# Patient Record
Sex: Male | Born: 1940 | Race: White | Hispanic: No | Marital: Married | Smoking: Never smoker
Health system: Southern US, Community
[De-identification: ages and names within clinical notes are randomized; demographics above are authoritative.]

## PROBLEM LIST (undated history)

## (undated) DIAGNOSIS — E7800 Pure hypercholesterolemia, unspecified: Secondary | ICD-10-CM

## (undated) DIAGNOSIS — H409 Unspecified glaucoma: Secondary | ICD-10-CM

## (undated) DIAGNOSIS — G709 Myoneural disorder, unspecified: Secondary | ICD-10-CM

## (undated) DIAGNOSIS — E785 Hyperlipidemia, unspecified: Secondary | ICD-10-CM

## (undated) DIAGNOSIS — C801 Malignant (primary) neoplasm, unspecified: Secondary | ICD-10-CM

## (undated) DIAGNOSIS — I10 Essential (primary) hypertension: Secondary | ICD-10-CM

## (undated) DIAGNOSIS — M1990 Unspecified osteoarthritis, unspecified site: Secondary | ICD-10-CM

## (undated) DIAGNOSIS — H9190 Unspecified hearing loss, unspecified ear: Secondary | ICD-10-CM

## (undated) HISTORY — PX: JOINT REPLACEMENT: SHX530

## (undated) HISTORY — PX: SKIN CANCER EXCISION: SHX779

## (undated) HISTORY — PX: TONSILLECTOMY: SUR1361

## (undated) HISTORY — PX: BACK SURGERY: SHX140

## (undated) HISTORY — DX: Pure hypercholesterolemia, unspecified: E78.00

## (undated) HISTORY — DX: Essential (primary) hypertension: I10

## (undated) HISTORY — PX: TOTAL HIP ARTHROPLASTY: SHX124

---
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
## 1998-03-03 ENCOUNTER — Ambulatory Visit (HOSPITAL_COMMUNITY): Admission: RE | Admit: 1998-03-03 | Discharge: 1998-03-03 | Payer: Self-pay | Admitting: Neurosurgery

## 2000-09-20 HISTORY — PX: PARTIAL HIP ARTHROPLASTY: SHX733

## 2000-09-30 ENCOUNTER — Encounter: Payer: Self-pay | Admitting: Orthopedic Surgery

## 2000-10-04 ENCOUNTER — Inpatient Hospital Stay (HOSPITAL_COMMUNITY): Admission: RE | Admit: 2000-10-04 | Discharge: 2000-10-07 | Payer: Self-pay | Admitting: Orthopedic Surgery

## 2000-10-04 ENCOUNTER — Encounter: Payer: Self-pay | Admitting: Orthopedic Surgery

## 2004-09-20 HISTORY — PX: TOTAL HIP ARTHROPLASTY: SHX124

## 2005-08-26 ENCOUNTER — Inpatient Hospital Stay (HOSPITAL_COMMUNITY): Admission: RE | Admit: 2005-08-26 | Discharge: 2005-08-31 | Payer: Self-pay | Admitting: Orthopedic Surgery

## 2012-03-06 ENCOUNTER — Other Ambulatory Visit: Payer: Self-pay | Admitting: Neurosurgery

## 2012-03-06 DIAGNOSIS — M549 Dorsalgia, unspecified: Secondary | ICD-10-CM

## 2012-03-06 DIAGNOSIS — M5410 Radiculopathy, site unspecified: Secondary | ICD-10-CM

## 2012-03-06 DIAGNOSIS — IMO0002 Reserved for concepts with insufficient information to code with codable children: Secondary | ICD-10-CM

## 2012-03-09 ENCOUNTER — Ambulatory Visit
Admission: RE | Admit: 2012-03-09 | Discharge: 2012-03-09 | Disposition: A | Discharge status: Home / Self Care | Payer: Medicare Other | Source: Ambulatory Visit | Attending: Neurosurgery | Admitting: Neurosurgery

## 2012-03-09 VITALS — BP 136/74 | HR 66

## 2012-03-09 DIAGNOSIS — M5410 Radiculopathy, site unspecified: Secondary | ICD-10-CM

## 2012-03-09 DIAGNOSIS — M549 Dorsalgia, unspecified: Secondary | ICD-10-CM

## 2012-03-09 DIAGNOSIS — IMO0002 Reserved for concepts with insufficient information to code with codable children: Secondary | ICD-10-CM

## 2012-03-09 MED ORDER — ONDANSETRON HCL 4 MG/2ML IJ SOLN
4.0000 mg | Freq: Once | INTRAMUSCULAR | Status: AC
  Administered 2012-03-09: 4 mg via INTRAMUSCULAR

## 2012-03-09 MED ORDER — IOHEXOL 180 MG/ML  SOLN
20.0000 mL | Freq: Once | INTRAMUSCULAR | Status: AC | PRN
  Administered 2012-03-09: 20 mL via INTRATHECAL

## 2012-03-09 MED ORDER — MEPERIDINE HCL 100 MG/ML IJ SOLN
75.0000 mg | Freq: Once | INTRAMUSCULAR | Status: AC
  Administered 2012-03-09: 75 mg via INTRAMUSCULAR

## 2012-03-09 MED ORDER — DIAZEPAM 5 MG PO TABS
5.0000 mg | ORAL_TABLET | Freq: Once | ORAL | Status: AC
  Administered 2012-03-09: 5 mg via ORAL

## 2012-03-09 MED ORDER — ONDANSETRON HCL 4 MG/2ML IJ SOLN: 4.0000 mg | Freq: Four times a day (QID) | INTRAMUSCULAR | Status: DC | PRN

## 2012-03-09 NOTE — Discharge Instructions (Signed)

## 2012-03-17 ENCOUNTER — Other Ambulatory Visit: Payer: Self-pay | Admitting: Neurosurgery

## 2012-03-20 ENCOUNTER — Encounter (HOSPITAL_COMMUNITY): Payer: Self-pay | Admitting: Pharmacy Technician

## 2012-03-22 ENCOUNTER — Encounter (HOSPITAL_COMMUNITY): Payer: Self-pay

## 2012-03-22 ENCOUNTER — Encounter (HOSPITAL_COMMUNITY)
Admission: RE | Admit: 2012-03-22 | Discharge: 2012-03-22 | Disposition: A | Discharge status: Home / Self Care | Payer: Medicare Other | Source: Ambulatory Visit | Attending: Neurosurgery | Admitting: Neurosurgery

## 2012-03-22 HISTORY — DX: Malignant (primary) neoplasm, unspecified: C80.1

## 2012-03-22 HISTORY — DX: Unspecified osteoarthritis, unspecified site: M19.90

## 2012-03-22 HISTORY — DX: Unspecified hearing loss, unspecified ear: H91.90

## 2012-03-22 HISTORY — DX: Hyperlipidemia, unspecified: E78.5

## 2012-03-22 HISTORY — DX: Myoneural disorder, unspecified: G70.9

## 2012-03-22 HISTORY — DX: Unspecified glaucoma: H40.9

## 2012-03-22 LAB — CBC
HCT: 40.4 % (ref 39.0–52.0)
Hemoglobin: 13.9 g/dL (ref 13.0–17.0)
MCH: 31.7 pg (ref 26.0–34.0)
MCV: 92 fL (ref 78.0–100.0)
RBC: 4.39 MIL/uL (ref 4.22–5.81)
WBC: 5.5 10*3/uL (ref 4.0–10.5)

## 2012-03-22 LAB — BASIC METABOLIC PANEL
Chloride: 103 mEq/L (ref 96–112)
Creatinine, Ser: 0.75 mg/dL (ref 0.50–1.35)
GFR calc Af Amer: >90 mL/min (ref >90)
GFR calc non Af Amer: 89 mL/min — ABNORMAL LOW (ref >90)
Potassium: 4.3 mEq/L (ref 3.5–5.1)

## 2012-03-22 LAB — TYPE AND SCREEN
ABO/RH(D): A POS
Antibody Screen: NEGATIVE

## 2012-03-22 NOTE — Pre-Procedure Instructions (Signed)
20 ELWARD NOCERA  03/22/2012   Your procedure is scheduled on:  March 28, 2012  Report to [facility] at 8:00 AM.  Call this number if you have problems the morning of surgery: [phone]   Remember:   Do not eat food or drink :After Midnight.      Take these medicines the morning of surgery with A SIP OF WATER: zyrtec   Do not wear jewelry, make-up or nail polish.  Do not wear lotions, powders, or perfumes. You may wear deodorant.  Do not shave 48 hours prior to surgery. Men may shave face and neck.  Do not bring valuables to the hospital.  Contacts, dentures or bridgework may not be worn into surgery.  Leave suitcase in the car. After surgery it may be brought to your room.  For patients admitted to the hospital, checkout time is 11:00 AM the day of discharge.   Patients discharged the day of surgery will not be allowed to drive home.  Name and phone number of your driver: Abem Shaddix [phone]  Special Instructions: CHG Shower Use Special Wash: 1/2 bottle night before surgery and 1/2 bottle morning of surgery.   Please read over the following fact sheets that you were given: Pain Booklet, Coughing and Deep Breathing, Blood Transfusion Information, MRSA Information and Surgical Site Infection Prevention

## 2012-03-27 MED ORDER — CEFAZOLIN SODIUM-DEXTROSE 2-3 GM-% IV SOLR
2.0000 g | INTRAVENOUS | Status: AC
  Administered 2012-03-28: 2 g via INTRAVENOUS
  Filled 2012-03-27 (×2): qty 50

## 2012-03-28 ENCOUNTER — Inpatient Hospital Stay (HOSPITAL_COMMUNITY): Payer: Medicare Other | Admitting: Anesthesiology

## 2012-03-28 ENCOUNTER — Encounter (HOSPITAL_COMMUNITY)
Admission: RE | Disposition: A | Discharge status: Home / Self Care | Payer: Self-pay | Source: Ambulatory Visit | Attending: Neurosurgery

## 2012-03-28 ENCOUNTER — Inpatient Hospital Stay (HOSPITAL_COMMUNITY)
Admission: RE | Admit: 2012-03-28 | Discharge: 2012-04-03 | DRG: 460 | Disposition: A | Discharge status: Home / Self Care | Payer: Medicare Other | Source: Ambulatory Visit | Attending: Neurosurgery | Admitting: Neurosurgery

## 2012-03-28 ENCOUNTER — Inpatient Hospital Stay (HOSPITAL_COMMUNITY): Payer: Medicare Other

## 2012-03-28 ENCOUNTER — Encounter (HOSPITAL_COMMUNITY): Payer: Self-pay | Admitting: Anesthesiology

## 2012-03-28 DIAGNOSIS — Z981 Arthrodesis status: Secondary | ICD-10-CM

## 2012-03-28 DIAGNOSIS — M48061 Spinal stenosis, lumbar region without neurogenic claudication: Principal | ICD-10-CM | POA: Diagnosis present

## 2012-03-28 DIAGNOSIS — M4310 Spondylolisthesis, site unspecified: Secondary | ICD-10-CM | POA: Diagnosis present

## 2012-03-28 LAB — GLUCOSE, CAPILLARY: Glucose-Capillary: 127 mg/dL — ABNORMAL HIGH (ref 70–99)

## 2012-03-28 SURGERY — POSTERIOR LUMBAR FUSION 1 LEVEL
Anesthesia: General | Site: Back | Wound class: Clean

## 2012-03-28 MED ORDER — MORPHINE SULFATE (PF) 1 MG/ML IV SOLN
INTRAVENOUS | Status: AC
  Administered 2012-03-28: 1 mg via INTRAVENOUS
  Filled 2012-03-28: qty 25

## 2012-03-28 MED ORDER — SODIUM CHLORIDE 0.9 % IV SOLN
INTRAVENOUS | Status: DC
  Administered 2012-03-28: 23:00:00 via INTRAVENOUS

## 2012-03-28 MED ORDER — LACTATED RINGERS IV SOLN
INTRAVENOUS | Status: DC | PRN
  Administered 2012-03-28 (×2): via INTRAVENOUS

## 2012-03-28 MED ORDER — PROPOFOL 10 MG/ML IV BOLUS
INTRAVENOUS | Status: DC | PRN
  Administered 2012-03-28: 120 mg via INTRAVENOUS

## 2012-03-28 MED ORDER — DIPHENHYDRAMINE HCL 50 MG/ML IJ SOLN: 12.5000 mg | Freq: Four times a day (QID) | INTRAMUSCULAR | Status: DC | PRN

## 2012-03-28 MED ORDER — ROCURONIUM BROMIDE 100 MG/10ML IV SOLN
INTRAVENOUS | Status: DC | PRN
  Administered 2012-03-28: 50 mg via INTRAVENOUS

## 2012-03-28 MED ORDER — DIAZEPAM 5 MG PO TABS
5.0000 mg | ORAL_TABLET | Freq: Four times a day (QID) | ORAL | Status: DC | PRN
  Administered 2012-03-29 – 2012-04-02 (×10): 5 mg via ORAL
  Filled 2012-03-28 (×10): qty 1

## 2012-03-28 MED ORDER — ACETAMINOPHEN 325 MG PO TABS
650.0000 mg | ORAL_TABLET | ORAL | Status: DC | PRN
  Administered 2012-03-29: 650 mg via ORAL
  Filled 2012-03-28: qty 2

## 2012-03-28 MED ORDER — SODIUM CHLORIDE 0.9 % IJ SOLN: 3.0000 mL | INTRAMUSCULAR | Status: DC | PRN

## 2012-03-28 MED ORDER — LIDOCAINE HCL (CARDIAC) 20 MG/ML IV SOLN
INTRAVENOUS | Status: DC | PRN
  Administered 2012-03-28: 60 mg via INTRAVENOUS

## 2012-03-28 MED ORDER — NALOXONE HCL 0.4 MG/ML IJ SOLN: 0.4000 mg | INTRAMUSCULAR | Status: DC | PRN

## 2012-03-28 MED ORDER — MENTHOL 3 MG MT LOZG
1.0000 | LOZENGE | OROMUCOSAL | Status: DC | PRN
  Filled 2012-03-28: qty 9

## 2012-03-28 MED ORDER — VECURONIUM BROMIDE 10 MG IV SOLR
INTRAVENOUS | Status: DC | PRN
  Administered 2012-03-28: 2 mg via INTRAVENOUS

## 2012-03-28 MED ORDER — SODIUM CHLORIDE 0.9 % IV SOLN: 250.0000 mL | INTRAVENOUS | Status: DC

## 2012-03-28 MED ORDER — MORPHINE SULFATE (PF) 1 MG/ML IV SOLN
INTRAVENOUS | Status: DC
  Administered 2012-03-28: 1 mg via INTRAVENOUS
  Administered 2012-03-29: 08:00:00 via INTRAVENOUS
  Filled 2012-03-28: qty 25

## 2012-03-28 MED ORDER — LATANOPROST 0.005 % OP SOLN
1.0000 [drp] | Freq: Every day | OPHTHALMIC | Status: DC
  Administered 2012-03-28 – 2012-04-02 (×6): 1 [drp] via OPHTHALMIC
  Filled 2012-03-28: qty 2.5

## 2012-03-28 MED ORDER — EPHEDRINE SULFATE 50 MG/ML IJ SOLN
INTRAMUSCULAR | Status: DC | PRN
  Administered 2012-03-28 (×3): 10 mg via INTRAVENOUS

## 2012-03-28 MED ORDER — ONDANSETRON HCL 4 MG/2ML IJ SOLN: 4.0000 mg | Freq: Four times a day (QID) | INTRAMUSCULAR | Status: DC | PRN

## 2012-03-28 MED ORDER — DIPHENHYDRAMINE HCL 12.5 MG/5ML PO ELIX: 12.5000 mg | ORAL_SOLUTION | Freq: Four times a day (QID) | ORAL | Status: DC | PRN

## 2012-03-28 MED ORDER — 0.9 % SODIUM CHLORIDE (POUR BTL) OPTIME
TOPICAL | Status: DC | PRN
  Administered 2012-03-28: 1000 mL

## 2012-03-28 MED ORDER — THROMBIN 20000 UNITS EX KIT
PACK | CUTANEOUS | Status: DC | PRN
  Administered 2012-03-28: 16:00:00 via TOPICAL

## 2012-03-28 MED ORDER — ONDANSETRON HCL 4 MG/2ML IJ SOLN
4.0000 mg | INTRAMUSCULAR | Status: DC | PRN
  Filled 2012-03-28: qty 2

## 2012-03-28 MED ORDER — PHENOL 1.4 % MT LIQD: 1.0000 | OROMUCOSAL | Status: DC | PRN

## 2012-03-28 MED ORDER — HYDROMORPHONE HCL PF 1 MG/ML IJ SOLN: 0.2500 mg | INTRAMUSCULAR | Status: DC | PRN

## 2012-03-28 MED ORDER — SODIUM CHLORIDE 0.9 % IV SOLN
INTRAVENOUS | Status: DC | PRN
  Administered 2012-03-28 (×3): via INTRAVENOUS

## 2012-03-28 MED ORDER — CEFAZOLIN SODIUM 1-5 GM-% IV SOLN
1.0000 g | Freq: Three times a day (TID) | INTRAVENOUS | Status: AC
  Administered 2012-03-28 – 2012-03-29 (×2): 1 g via INTRAVENOUS
  Filled 2012-03-28 (×2): qty 50

## 2012-03-28 MED ORDER — SODIUM CHLORIDE 0.9 % IJ SOLN: 9.0000 mL | INTRAMUSCULAR | Status: DC | PRN

## 2012-03-28 MED ORDER — MIDAZOLAM HCL 5 MG/5ML IJ SOLN
INTRAMUSCULAR | Status: DC | PRN
  Administered 2012-03-28: 2 mg via INTRAVENOUS

## 2012-03-28 MED ORDER — FENTANYL CITRATE 0.05 MG/ML IJ SOLN
INTRAMUSCULAR | Status: DC | PRN
  Administered 2012-03-28 (×8): 50 ug via INTRAVENOUS
  Administered 2012-03-28: 100 ug via INTRAVENOUS

## 2012-03-28 MED ORDER — ACETAMINOPHEN 650 MG RE SUPP: 650.0000 mg | RECTAL | Status: DC | PRN

## 2012-03-28 MED ORDER — BACITRACIN ZINC 500 UNIT/GM EX OINT
TOPICAL_OINTMENT | CUTANEOUS | Status: DC | PRN
  Administered 2012-03-28: 1 via TOPICAL

## 2012-03-28 MED ORDER — DROPERIDOL 2.5 MG/ML IJ SOLN: 0.6250 mg | INTRAMUSCULAR | Status: DC | PRN

## 2012-03-28 MED ORDER — OXYCODONE-ACETAMINOPHEN 5-325 MG PO TABS
1.0000 | ORAL_TABLET | ORAL | Status: DC | PRN
  Administered 2012-03-29 – 2012-04-03 (×18): 2 via ORAL
  Filled 2012-03-28 (×17): qty 2

## 2012-03-28 MED ORDER — SIMVASTATIN 40 MG PO TABS
40.0000 mg | ORAL_TABLET | Freq: Every day | ORAL | Status: DC
  Administered 2012-03-28 – 2012-04-02 (×6): 40 mg via ORAL
  Filled 2012-03-28 (×8): qty 1

## 2012-03-28 MED ORDER — SODIUM CHLORIDE 0.9 % IJ SOLN: 3.0000 mL | Freq: Two times a day (BID) | INTRAMUSCULAR | Status: DC

## 2012-03-28 MED ORDER — HEPARIN SODIUM (PORCINE) 1000 UNIT/ML IJ SOLN
INTRAMUSCULAR | Status: AC
  Filled 2012-03-28: qty 1

## 2012-03-28 SURGICAL SUPPLY — 78 items
APL SKNCLS STERI-STRIP NONHPOA (GAUZE/BANDAGES/DRESSINGS) ×1
BENZOIN TINCTURE PRP APPL 2/3 (GAUZE/BANDAGES/DRESSINGS) ×2 IMPLANT
BLADE SURG ROTATE 9660 (MISCELLANEOUS) IMPLANT
BUR ACORN 6.0 (BURR) ×4 IMPLANT
BUR MATCHSTICK NEURO 3.0 LAGG (BURR) ×4 IMPLANT
CANISTER SUCTION 2500CC (MISCELLANEOUS) ×2 IMPLANT
CAP REVERE LOCKING (Cap) ×8 IMPLANT
CLOTH BEACON ORANGE TIMEOUT ST (SAFETY) ×2 IMPLANT
CONT SPEC 4OZ CLIKSEAL STRL BL (MISCELLANEOUS) ×4 IMPLANT
CORDS BIPOLAR (ELECTRODE) ×2 IMPLANT
COVER BACK TABLE 24X17X13 BIG (DRAPES) IMPLANT
COVER TABLE BACK 60X90 (DRAPES) ×2 IMPLANT
DRAPE C-ARM 42X72 X-RAY (DRAPES) ×4 IMPLANT
DRAPE LAPAROTOMY 100X72X124 (DRAPES) ×2 IMPLANT
DRAPE MICROSCOPE LEICA (MISCELLANEOUS) ×2 IMPLANT
DRAPE POUCH INSTRU U-SHP 10X18 (DRAPES) ×2 IMPLANT
DRSG PAD ABDOMINAL 8X10 ST (GAUZE/BANDAGES/DRESSINGS) ×2 IMPLANT
DURAPREP 26ML APPLICATOR (WOUND CARE) ×2 IMPLANT
ELECT BLADE 4.0 EZ CLEAN MEGAD (MISCELLANEOUS) ×2
ELECT REM PT RETURN 9FT ADLT (ELECTROSURGICAL) ×2
ELECTRODE BLDE 4.0 EZ CLN MEGD (MISCELLANEOUS) ×1 IMPLANT
ELECTRODE REM PT RTRN 9FT ADLT (ELECTROSURGICAL) ×1 IMPLANT
EVACUATOR 1/8 PVC DRAIN (DRAIN) IMPLANT
GAUZE SPONGE 4X4 16PLY XRAY LF (GAUZE/BANDAGES/DRESSINGS) ×2 IMPLANT
GLOVE BIO SURGEON STRL SZ 6.5 (GLOVE) ×4 IMPLANT
GLOVE BIOGEL M 8.0 STRL (GLOVE) ×4 IMPLANT
GLOVE BIOGEL PI IND STRL 6.5 (GLOVE) ×1 IMPLANT
GLOVE BIOGEL PI IND STRL 7.0 (GLOVE) ×1 IMPLANT
GLOVE BIOGEL PI IND STRL 7.5 (GLOVE) ×1 IMPLANT
GLOVE BIOGEL PI IND STRL 8 (GLOVE) ×2 IMPLANT
GLOVE BIOGEL PI INDICATOR 6.5 (GLOVE) ×1
GLOVE BIOGEL PI INDICATOR 7.0 (GLOVE) ×1
GLOVE BIOGEL PI INDICATOR 7.5 (GLOVE) ×1
GLOVE BIOGEL PI INDICATOR 8 (GLOVE) ×2
GLOVE ECLIPSE 7.5 STRL STRAW (GLOVE) ×8 IMPLANT
GLOVE EXAM NITRILE LRG STRL (GLOVE) IMPLANT
GLOVE EXAM NITRILE MD LF STRL (GLOVE) ×2 IMPLANT
GLOVE EXAM NITRILE XL STR (GLOVE) IMPLANT
GLOVE EXAM NITRILE XS STR PU (GLOVE) IMPLANT
GLOVE SURG SS PI 6.5 STRL IVOR (GLOVE) ×4 IMPLANT
GOWN BRE IMP SLV AUR LG STRL (GOWN DISPOSABLE) ×8 IMPLANT
GOWN BRE IMP SLV AUR XL STRL (GOWN DISPOSABLE) ×4 IMPLANT
GOWN STRL REIN 2XL LVL4 (GOWN DISPOSABLE) ×2 IMPLANT
KIT BASIN OR (CUSTOM PROCEDURE TRAY) ×2 IMPLANT
KIT ROOM TURNOVER OR (KITS) ×2 IMPLANT
MILL MEDIUM DISP (BLADE) ×2 IMPLANT
NEEDLE HYPO 18GX1.5 BLUNT FILL (NEEDLE) IMPLANT
NEEDLE HYPO 21X1.5 SAFETY (NEEDLE) ×2 IMPLANT
NEEDLE HYPO 25X1 1.5 SAFETY (NEEDLE) IMPLANT
NS IRRIG 1000ML POUR BTL (IV SOLUTION) ×2 IMPLANT
PACK FOAM VITOSS 10CC (Orthopedic Implant) ×2 IMPLANT
PACK LAMINECTOMY NEURO (CUSTOM PROCEDURE TRAY) ×2 IMPLANT
PAD ARMBOARD 7.5X6 YLW CONV (MISCELLANEOUS) ×6 IMPLANT
PATTIES SURGICAL .5 X1 (DISPOSABLE) ×2 IMPLANT
PATTIES SURGICAL .5 X3 (DISPOSABLE) IMPLANT
PATTIES SURGICAL 1X1 (DISPOSABLE) ×4 IMPLANT
ROD REVERE 6.35 40MM (Rod) ×4 IMPLANT
RUBBERBAND STERILE (MISCELLANEOUS) ×4 IMPLANT
SCREW REVERE 5.5X45 (Screw) ×8 IMPLANT
SPACER SUSTAIN SMALL ARCH 9MM (Peek) ×2 IMPLANT
SPONGE GAUZE 4X4 12PLY (GAUZE/BANDAGES/DRESSINGS) ×2 IMPLANT
SPONGE LAP 4X18 X RAY DECT (DISPOSABLE) IMPLANT
SPONGE NEURO XRAY DETECT 1X3 (DISPOSABLE) IMPLANT
SPONGE SURGIFOAM ABS GEL 100 (HEMOSTASIS) ×2 IMPLANT
STAPLER SKIN PROX WIDE 3.9 (STAPLE) ×2 IMPLANT
STRIP CLOSURE SKIN 1/2X4 (GAUZE/BANDAGES/DRESSINGS) ×2 IMPLANT
SUT VIC AB 1 CT1 18XBRD ANBCTR (SUTURE) ×2 IMPLANT
SUT VIC AB 1 CT1 8-18 (SUTURE) ×4
SUT VIC AB 2-0 CP2 18 (SUTURE) ×4 IMPLANT
SUT VIC AB 3-0 SH 8-18 (SUTURE) ×2 IMPLANT
SYR 20CC LL (SYRINGE) ×2 IMPLANT
SYR 20ML ECCENTRIC (SYRINGE) ×2 IMPLANT
SYR 5ML LL (SYRINGE) IMPLANT
TAPE CLOTH SURG 4X10 WHT LF (GAUZE/BANDAGES/DRESSINGS) ×2 IMPLANT
TOWEL OR 17X24 6PK STRL BLUE (TOWEL DISPOSABLE) ×2 IMPLANT
TOWEL OR 17X26 10 PK STRL BLUE (TOWEL DISPOSABLE) ×2 IMPLANT
TRAY FOLEY CATH 14FRSI W/METER (CATHETERS) ×2 IMPLANT
WATER STERILE IRR 1000ML POUR (IV SOLUTION) ×2 IMPLANT

## 2012-03-28 NOTE — Anesthesia Postprocedure Evaluation (Signed)
Anesthesia Post Note  Patient: William [name]  Procedure(s) Performed: Procedure(s) (LRB): POSTERIOR LUMBAR FUSION 1 LEVEL (N/A)  Anesthesia type: general  Patient location: PACU  Post pain: Pain level controlled  Post assessment: Patient's Cardiovascular Status Stable  Last Vitals:  Filed Vitals:   03/28/12 2015  BP:   Pulse: 79  Temp: 36.8 C  Resp: 8    Post vital signs: Reviewed and stable  Level of consciousness: sedated  Complications: No apparent anesthesia complications

## 2012-03-28 NOTE — Anesthesia Procedure Notes (Signed)
Procedure Name: Intubation Date/Time: 03/28/2012 3:14 PM Performed by: Alanda Amass A Pre-anesthesia Checklist: Patient identified, Timeout performed, Emergency Drugs available, Suction available and Patient being monitored Patient Re-evaluated:Patient Re-evaluated prior to inductionOxygen Delivery Method: Circle system utilized Preoxygenation: Pre-oxygenation with 100% oxygen Intubation Type: IV induction Ventilation: Mask ventilation without difficulty Laryngoscope Size: Mac and 3 Grade View: Grade I Tube type: Oral Tube size: 7.5 mm Number of attempts: 1 Airway Equipment and Method: Stylet Placement Confirmation: ETT inserted through vocal cords under direct vision,  breath sounds checked- equal and bilateral and positive ETCO2 Secured at: 21 cm Tube secured with: Tape Dental Injury: Teeth and Oropharynx as per pre-operative assessment

## 2012-03-28 NOTE — H&P (Signed)
William [name] is an 71 y.o. male.   Chief Complaint: lbp HPI: several years history with lumbar pain associated with both legs pain with ambulation.epidural steroids of no help. Standing   is painful. Had a mri and xrays and he was send to [place] for treatment.  Past Medical History  Diagnosis Date  . Hyperlipemia   . Diabetes mellitus     borderline controlled with diet  . Neuromuscular disorder     numbness/tingling lower extremeties  . Cancer     skin ca, basal cell  . Arthritis   . Glaucoma (increased eye pressure)     bilaterally  . Hearing difficulty     "has ringing in left ear"    Past Surgical History  Procedure Date  . Appendectomy   . Tonsillectomy   . Total hip arthroplasty     x 2  . Partial hip arthroplasty     x 1  . Back surgery     low back surgery right side 1997  . Skin cancer excision     No family history on file. Social History:  reports that he has never smoked. He does not have any smokeless tobacco history on file. He reports that he does not drink alcohol or use illicit drugs.  Allergies: No Known Allergies  Medications Prior to Admission  Medication Sig Dispense Refill  . cetirizine (ZYRTEC) 10 MG tablet Take 10 mg by mouth daily as needed. For allergies      . latanoprost (XALATAN) 0.005 % ophthalmic solution Place 1 drop into both eyes at bedtime.      Marland Kitchen Lysine 500 MG CAPS Take 1 capsule by mouth daily.      . simvastatin (ZOCOR) 40 MG tablet Take 40 mg by mouth at bedtime.      Marland Kitchen aspirin EC 81 MG tablet Take 81 mg by mouth daily.      . Multiple Vitamin (MULTIVITAMIN WITH MINERALS) TABS Take 1 tablet by mouth daily.      . naproxen sodium (ANAPROX) 220 MG tablet Take 220 mg by mouth 2 (two) times daily with a meal.      . Omega-3 Fatty Acids (FISH OIL) 1200 MG CAPS Take 2 capsules by mouth daily.        Results for orders placed during the hospital encounter of 03/28/12 (from the past 48 hour(s))  GLUCOSE, CAPILLARY     Status: Abnormal     Collection Time   03/28/12  8:20 AM      Component Value Range Comment   Glucose-Capillary 127 (*) 70 - 99 mg/dL    Comment 1 Notify RN      No results found.  Review of Systems  Constitutional: Negative.   HENT: Positive for hearing loss.   Eyes: Negative.   Respiratory: Negative.   Cardiovascular: Negative.   Gastrointestinal: Negative.   Musculoskeletal: Positive for back pain.  Skin: Negative.   Neurological: Positive for focal weakness.  Endo/Heme/Allergies: Negative.   Psychiatric/Behavioral: Negative.     Blood pressure 161/78, pulse 61, temperature 97.5 F (36.4 C), temperature source Oral, resp. rate 20, SpO2 99.00%. Physical Exam hent.decrease of hearing.neck, nl. Cv, nl. Lungs mild rales bilaterally. Abdome, soft. Extremities, nl NEURO patient came to my office limping and using a cane. 2/5 weakness of feet df and 4/5 pf. i can break both quadriceps . Femoral maneuver positive bilaterally. Mri shows stenosis from l1 to l5patient and wifwe are aware of risks and benefits of the surgery  with step-off at l45.   Assessment/Plandecompression from l1 to l5 with fusion with pedicles screws and cages at l45.   William [name] M 03/28/2012, 12:50 PM

## 2012-03-28 NOTE — Anesthesia Preprocedure Evaluation (Addendum)
Anesthesia Evaluation  Patient identified by MRN, date of birth, ID band Patient awake    Reviewed: Allergy & Precautions, H&P , NPO status , Patient's Chart, lab work & pertinent test results  Airway Mallampati: II TM Distance: >3 FB Neck ROM: Full    Dental No notable dental hx. (+) Partial Lower, Partial Upper and Dental Advisory Given   Pulmonary neg pulmonary ROS,  breath sounds clear to auscultation  Pulmonary exam normal       Cardiovascular negative cardio ROS  Rhythm:Regular Rate:Normal     Neuro/Psych negative neurological ROS  negative psych ROS   GI/Hepatic negative GI ROS, Neg liver ROS,   Endo/Other  Well Controlled  Renal/GU negative Renal ROS  negative genitourinary   Musculoskeletal   Abdominal   Peds  Hematology negative hematology ROS (+)   Anesthesia Other Findings   Reproductive/Obstetrics negative OB ROS                           Anesthesia Physical Anesthesia Plan  ASA: II  Anesthesia Plan: General   Post-op Pain Management:    Induction: Intravenous  Airway Management Planned: Oral ETT  Additional Equipment:   Intra-op Plan:   Post-operative Plan: Extubation in OR  Informed Consent: I have reviewed the patients History and Physical, chart, labs and discussed the procedure including the risks, benefits and alternatives for the proposed anesthesia with the patient or authorized representative who has indicated his/her understanding and acceptance.   Dental advisory given  Plan Discussed with: CRNA and Surgeon  Anesthesia Plan Comments:         Anesthesia Quick Evaluation

## 2012-03-28 NOTE — Transfer of Care (Signed)
Immediate Anesthesia Transfer of Care Note  Patient: William [name]  Procedure(s) Performed: Procedure(s) (LRB): POSTERIOR LUMBAR FUSION 1 LEVEL (N/A)  Patient Location: PACU  Anesthesia Type: General  Level of Consciousness: sedated  Airway & Oxygen Therapy: Patient Spontanous Breathing and Patient connected to nasal cannula oxygen  Post-op Assessment: Report given to PACU RN and Post -op Vital signs reviewed and stable  Post vital signs: Reviewed and stable  Complications: No apparent anesthesia complications

## 2012-03-29 ENCOUNTER — Encounter (HOSPITAL_COMMUNITY): Payer: Self-pay | Admitting: *Deleted

## 2012-03-29 MED ORDER — PNEUMOCOCCAL VAC POLYVALENT 25 MCG/0.5ML IJ INJ
0.5000 mL | INJECTION | INTRAMUSCULAR | Status: AC
  Administered 2012-03-30: 0.5 mL via INTRAMUSCULAR
  Filled 2012-03-29: qty 0.5

## 2012-03-29 NOTE — Op Note (Signed)
NAMEYVON, MCCORD NO.:  [id number]  MEDICAL RECORD NO.:  [id number]  LOCATION:  4N06C                        FACILITY:  [facility]  PHYSICIAN:  Hilda Lias, M.D.   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  PROCEDURE:  Bilateral L2, L3, L4, and L5 laminectomy, partial of L1, decompression of the thecal sac, foraminotomy.  L4-5 diskectomy, left side.  Lysis of adhesion, facetectomy, interbody fusion with a cage. Pedicle screws at L4-L5, posterolateral arthrodesis with autograft and Vitoss.  Cell Saver, C-arm.  Microscope.  SURGEON:  Hilda Lias, MD  ASSISTANT:  Reinaldo Meeker, MD  CLINICAL HISTORY:  Mr. Morillo is a gentleman who in the past underwent surgery at the level of 4-5 in the right side.  He came to my office several weeks ago with his wife complaining of back pain radiation to both legs up to the point and it was quite painful.  Walking after 40-50 feet, he had to sit because of severe pain.  X-rays showed severe stenosis from L1-L2 to L5-S1 with a grade 1 spondylolisthesis at the level of 4-5.  The patient want to proceed with surgery, and the surgery was fully explained to him and his wife including the complication such as no improvement whatsoever, infection, CSF leak, no improvement, need of further surgery.  PROCEDURE:  The patient was taken to the OR, and after intubation, he was positioned in a prone manner.  The back was cleaned with DuraPrep. Drapes were applied.  Midline incision following the previous one from L1-L2 down to L5 was made.  The incision was carried straight down to the  fascia and muscle tissue.  Retraction was done laterally.  X-rays showed that the clip was at the level of L2, spinous process, and L4. From then on, we proceeded with the removal of spinous process of 2, 3, 4, 5 as well as the lamina at those three levels.  The patient had quite a bit of narrowing.  At the level of L1-L2, we did partial laminotomy of L1 with removal of the yellow ligament and decompression of the thecal sac as well as the yellow ligament.  The area was quite narrow, not only at 2-3, but also at 3-4.  The patient had quite a bit of calcification of the yellow ligament and decompression was achieved.  Then, our attention was at the level of 4-5.  The lamina was removed easily and in the left side, we were able to go laterally.  In the right side, the patient had quite a bit of adhesion to the point that it was quite difficult to decompress the L5.  The L4 was decompressed with the help of the microscope.  Then, decompression of the L5 area was done. Because of the adhesion in the right side at L4-5, we entered the disk space in the left side  and diskectomy from that side was done.  The endplate was removed.  Then, a banana cage with autograft was inserted. Then, using the C-arm first in the AP view and then in lateral view, we probe the pedicle of L4-L5.  After the hole was made, we were able to feel that we were ultimately surrounded by bone.  Then, four screws of 5.5 x 45 was inserted and the screw was secured in place with a rod and Capps.  Then, we went laterally and the periosteum of the L4-5 bilaterally was removed and a mix of Vitoss and autograft was used for arthrodesis.  With the microscope, we went back and we were able to decompress the foramen of L1-2, L2-3, L3-4, and L4-5 bilaterally except at the L5 in the right side.  The L1 was wide open. Valsalva maneuver twice up to 40 was negative.  Then, the area was irrigated and the wound was closed with Vicryl and  staple.          ______________________________ Hilda Lias, M.D.     EB/MEDQ  D:  03/28/2012  T:  03/29/2012  Job:  [id number]

## 2012-03-29 NOTE — Progress Notes (Signed)
UR COMPLETED  

## 2012-03-29 NOTE — Progress Notes (Addendum)
NT reported temp of 101.0. Pt given IS and instructed on its use and tylenol. Will recheck temp shortly.- Follow up temperature is 99.1 Pt temp at 0600 was 100.7. Oncoming RN notified.

## 2012-03-29 NOTE — Evaluation (Signed)
Physical Therapy Evaluation Patient Details Name: William [name] MRN: [id number] DOB: 1941-07-12 Today's Date: 03/29/2012 Time: 7829-5621 PT Time Calculation (min): 18 min  PT Assessment / Plan / Recommendation Clinical Impression  Pt is a 71 y/o male admitted s/p L1-5 decompression and fusion along with the below PT problem list.  Pt would benefit from acute PT to maximize independence and facilitate d/c home with HHPT.    PT Assessment  Patient needs continued PT services    Follow Up Recommendations  Home health PT    Barriers to Discharge None      Equipment Recommendations  Rolling walker with 5" wheels    Recommendations for Other Services     Frequency Min 5X/week    Precautions / Restrictions Precautions Precautions: Back Precaution Booklet Issued: Yes (comment) Precaution Comments: Educated on 3/3 back precautions. Required Braces or Orthoses: Spinal Brace Spinal Brace: Lumbar corset;Applied in sitting position Restrictions Weight Bearing Restrictions: No   Pertinent Vitals/Pain 5/10 in back.  Pt repositioned.      Mobility  Bed Mobility Bed Mobility: Not assessed Transfers Transfers: Sit to Stand;Stand to Sit Sit to Stand: 4: Min assist;With upper extremity assist;From chair/3-in-1 Stand to Sit: 4: Min assist;With upper extremity assist;To chair/3-in-1 Details for Transfer Assistance: Assist for balance with cues for safest hand placement. Ambulation/Gait Ambulation/Gait Assistance: 4: Min assist Ambulation Distance (Feet): 40 Feet Assistive device: Rolling walker Ambulation/Gait Assistance Details: Assist for balance with cues for tall posture with extension at trunk and hips/knees and safety inside RW. Gait Pattern: Decreased stride length;Antalgic;Trunk flexed Stairs: No Wheelchair Mobility Wheelchair Mobility: No    Exercises     PT Diagnosis: Difficulty walking;Acute pain  PT Problem List: Decreased strength;Decreased activity  tolerance;Decreased balance;Decreased mobility;Decreased knowledge of use of DME;Decreased knowledge of precautions;Pain PT Treatment Interventions: DME instruction;Gait training;Stair training;Functional mobility training;Therapeutic activities;Balance training;Patient/family education   PT Goals Acute Rehab PT Goals PT Goal Formulation: With patient/family Time For Goal Achievement: 04/05/12 Potential to Achieve Goals: Good Pt will Roll Supine to Right Side: with modified independence PT Goal: Rolling Supine to Right Side - Progress: Goal set today Pt will Roll Supine to Left Side: with modified independence PT Goal: Rolling Supine to Left Side - Progress: Goal set today Pt will go Supine/Side to Sit: with modified independence PT Goal: Supine/Side to Sit - Progress: Goal set today Pt will go Sit to Supine/Side: with modified independence PT Goal: Sit to Supine/Side - Progress: Goal set today Pt will go Sit to Stand: with modified independence PT Goal: Sit to Stand - Progress: Goal set today Pt will go Stand to Sit: with modified independence PT Goal: Stand to Sit - Progress: Goal set today Pt will Ambulate: >150 feet;with modified independence;with least restrictive assistive device PT Goal: Ambulate - Progress: Goal set today Pt will Go Up / Down Stairs: 1-2 stairs;with modified independence;with least restrictive assistive device PT Goal: Up/Down Stairs - Progress: Goal set today  Visit Information  Last PT Received On: 03/29/12 Assistance Needed: +1    Subjective Data  Subjective: "It hurts some, but I had to get up." Patient Stated Goal: Go home.   Prior Functioning  Home Living Lives With: Spouse Available Help at Discharge: Family Type of Home: House Home Access: Stairs to enter Secretary/administrator of Steps: 1 Entrance Stairs-Rails: None Home Layout: One level Bathroom Shower/Tub: Forensic scientist: Standard Home Adaptive Equipment:  Straight cane Prior Function Level of Independence: Independent Able to Take Stairs?: Yes Driving: Yes  Vocation: Retired Musician: No difficulties    Cognition  Overall Cognitive Status: Appears within functional limits for tasks assessed/performed Arousal/Alertness: Awake/alert Orientation Level: Appears intact for tasks assessed Behavior During Session: [facility] for tasks performed    Extremity/Trunk Assessment Right Upper Extremity Assessment RUE ROM/Strength/Tone: Within functional levels RUE Sensation: WFL - Light Touch RUE Coordination: WFL - gross/fine motor Left Upper Extremity Assessment LUE ROM/Strength/Tone: Within functional levels LUE Sensation: WFL - Light Touch LUE Coordination: WFL - gross/fine motor Right Lower Extremity Assessment RLE ROM/Strength/Tone: Within functional levels RLE Sensation: WFL - Light Touch RLE Coordination: WFL - gross/fine motor Left Lower Extremity Assessment LLE ROM/Strength/Tone: Within functional levels LLE Sensation: WFL - Light Touch LLE Coordination: WFL - gross/fine motor Trunk Assessment Trunk Assessment: Normal   Balance Balance Balance Assessed: No  End of Session PT - End of Session Equipment Utilized During Treatment: Gait belt;Back brace Activity Tolerance: Patient tolerated treatment well Patient left: in chair;with call bell/phone within reach;with family/visitor present Nurse Communication: Mobility status  GP     Cephus Shelling 03/29/2012, 9:37 AM  03/29/2012 Cephus Shelling, PT, DPT [phone]

## 2012-03-29 NOTE — Progress Notes (Signed)
Occupational Therapy Evaluation Patient Details Name: William [name] MRN: [id number] DOB: 1941-06-02 Today's Date: 03/29/2012 Time: 1445-1510 OT Time Calculation (min): 25 min  OT Assessment / Plan / Recommendation Clinical Impression  Pt admitted s/p L1-5 decompression and fusion thus affecting PLOF. Will benefit from acute OT services to address below problem list in prep for d/c home with assist from wife.    OT Assessment  Patient needs continued OT Services    Follow Up Recommendations  Home health OT;Supervision/Assistance - 24 hour    Barriers to Discharge      Equipment Recommendations  3 in 1 bedside comode;Tub/shower seat    Recommendations for Other Services    Frequency  Min 2X/week    Precautions / Restrictions Precautions Precautions: Back Precaution Booklet Issued: Yes (comment) Precaution Comments: Educated on 3/3 back precautions. Required Braces or Orthoses: Spinal Brace Spinal Brace: Lumbar corset;Applied in sitting position   Pertinent Vitals/Pain See vitals    ADL  Upper Body Dressing: Performed;Set up Where Assessed - Upper Body Dressing: Unsupported sitting Lower Body Dressing: Performed;Moderate assistance Where Assessed - Lower Body Dressing: Supported sit to stand Toilet Transfer: Counsellor Method: Sit to Barista: Other (comment) (chair to EOB) Equipment Used: Back brace;Rolling walker;Gait belt Transfers/Ambulation Related to ADLs: Min guard assist with RW throughout room and unit (pt requesting to ambulate in hall). ADL Comments: Pt unable to cross ankles over legs to access feet. Discussed with pt techniques to adhere to back precautions while performing LB ADLs as well as need for seat when in shower.    OT Diagnosis: Generalized weakness;Acute pain  OT Problem List: Decreased activity tolerance;Decreased knowledge of use of DME or AE;Decreased knowledge of precautions;Pain OT Treatment  Interventions: Self-care/ADL training;DME and/or AE instruction;Therapeutic activities;Patient/family education   OT Goals Acute Rehab OT Goals OT Goal Formulation: With patient Time For Goal Achievement: 04/05/12 Potential to Achieve Goals: Good ADL Goals Pt Will Perform Grooming: with modified independence;Standing at sink ADL Goal: Grooming - Progress: Goal set today Pt Will Perform Lower Body Bathing: with modified independence;Sit to stand from bed;Sit to stand from chair ADL Goal: Lower Body Bathing - Progress: Goal set today Pt Will Perform Lower Body Dressing: with modified independence;Sit to stand from chair;Sit to stand from bed;with adaptive equipment ADL Goal: Lower Body Dressing - Progress: Goal set today Pt Will Transfer to Toilet: with modified independence;Ambulation;with DME;Comfort height toilet;Maintaining back safety precautions ADL Goal: Toilet Transfer - Progress: Goal set today Pt Will Perform Tub/Shower Transfer: Tub transfer;with modified independence;Ambulation;with DME;Shower seat with back;Maintaining back safety precautions ADL Goal: Tub/Shower Transfer - Progress: Goal set today Additional ADL Goal #1: Pt will independently verbalize and generalize 3/3 back precautions during all ADL activity. ADL Goal: Additional Goal #1 - Progress: Goal set today Miscellaneous OT Goals Miscellaneous OT Goal #1: Pt will perform bed mobility with mod I in prep for EOB ADLs. OT Goal: Miscellaneous Goal #1 - Progress: Goal set today  Visit Information  Last OT Received On: 03/29/12 Assistance Needed: +1    Subjective Data      Prior Functioning  Home Living Lives With: Spouse Available Help at Discharge: Family Type of Home: House Home Access: Stairs to enter Secretary/administrator of Steps: 1 Entrance Stairs-Rails: None Home Layout: One level Bathroom Shower/Tub: Forensic scientist: Standard Home Adaptive Equipment: Straight  cane;Reacher Prior Function Level of Independence: Independent Able to Take Stairs?: Yes Driving: Yes Vocation: Retired Musician: No difficulties  Cognition  Overall Cognitive Status: Appears within functional limits for tasks assessed/performed Arousal/Alertness: Awake/alert Orientation Level: Appears intact for tasks assessed Behavior During Session: [facility] for tasks performed    Extremity/Trunk Assessment Right Upper Extremity Assessment RUE ROM/Strength/Tone: Within functional levels Left Upper Extremity Assessment LUE ROM/Strength/Tone: Within functional levels   Mobility Bed Mobility Bed Mobility: Sit to Sidelying Left Sit to Sidelying Left: 3: Mod assist;HOB flat Details for Bed Mobility Assistance: Assist to prevent twisting.  VC for sequencing and technique. Transfers Sit to Stand: 4: Min guard;From chair/3-in-1;With armrests;With upper extremity assist Stand to Sit: 4: Min guard;With upper extremity assist;To bed Details for Transfer Assistance: Min guard for safety. VC for safe hand placement.   Exercise    Balance    End of Session OT - End of Session Equipment Utilized During Treatment: Gait belt;Back brace Activity Tolerance: Patient tolerated treatment well Patient left: in bed;with call bell/phone within reach;with family/visitor present Nurse Communication: Mobility status  GO   03/29/2012 Cipriano Mile OTR/L Pager [phone] Office [phone]   Cipriano Mile 03/29/2012, 5:27 PM

## 2012-03-29 NOTE — Progress Notes (Signed)
Patient ID: William [name], male   DOB: 1941/07/12, 72 y.o.   MRN: [id number] C/o incisional pain.no weakness. No sensory changes. Wound dry.

## 2012-03-29 NOTE — Clinical Social Work Note (Signed)
CSW received consult for SNF. Discussed pt during progression with RN. PT is recommending home health PT. RNCM is aware and following. CSW is signing off as no further needs identified. Please reconsult if a need arises prior to discharge.   Quamesha Mullet, MSW, LCSWA #[phone] 

## 2012-03-30 MED FILL — Heparin Sodium (Porcine) Inj 1000 Unit/ML: INTRAMUSCULAR | Qty: 30 | Status: AC

## 2012-03-30 MED FILL — Sodium Chloride IV Soln 0.9%: INTRAVENOUS | Qty: 1000 | Status: AC

## 2012-03-30 NOTE — Progress Notes (Signed)
Patient ID: William [name], male   DOB: 24-Apr-1941, 71 y.o.   MRN: [id number] Decrease of incisional pain. Decrease of drainage. dermabond used to the incision. No headache

## 2012-03-30 NOTE — Progress Notes (Signed)
Patient ID: William [name], male   DOB: Apr 19, 1941, 71 y.o.   MRN: [id number] Afebril, no pain in legs while walking. Some watery drainage but no headache. Wants to go home. He is to continue with pt. i will check him this pm.

## 2012-03-30 NOTE — Progress Notes (Signed)
Physical Therapy Treatment Patient Details Name: William [name] MRN: [id number] DOB: 04-Jun-1941 Today's Date: 03/30/2012 Time: 0750-0819 PT Time Calculation (min): 29 min  PT Assessment / Plan / Recommendation Comments on Treatment Session  Pt admitted s/p back surgery and continues to progress being able to tolerate ambulation as well as stair negotiation this am.     Follow Up Recommendations  Home health PT    Barriers to Discharge        Equipment Recommendations  Rolling walker with 5" wheels    Recommendations for Other Services    Frequency Min 5X/week   Plan Discharge plan remains appropriate;Frequency remains appropriate    Precautions / Restrictions Precautions Precautions: Back Precaution Booklet Issued: No Precaution Comments: Pt able to recall 1/3 back precautions.  Re-educated on 3/3 back precautions. Required Braces or Orthoses: Spinal Brace Spinal Brace: Lumbar corset;Applied in sitting position Restrictions Weight Bearing Restrictions: No   Pertinent Vitals/Pain 3/10 in back.  Pt repositioned.    Mobility  Bed Mobility Bed Mobility: Not assessed Transfers Transfers: Sit to Stand;Stand to Sit Sit to Stand: 5: Supervision;With upper extremity assist;From chair/3-in-1 Stand to Sit: 5: Supervision;With upper extremity assist;To chair/3-in-1 Details for Transfer Assistance: Verbal cues for safest hand placement. Ambulation/Gait Ambulation/Gait Assistance: 5: Supervision Ambulation Distance (Feet): 150 Feet Assistive device: Rolling walker Ambulation/Gait Assistance Details: Verbal cues for tall posture and safety with RW to protect back. Gait Pattern: Decreased stride length;Antalgic;Trunk flexed Stairs: Yes Stairs Assistance: 5: Supervision Stairs Assistance Details (indicate cue type and reason): Verbal cues for safe sequence going backwards with RW. Stair Management Technique: Step to pattern;Backwards;With walker Number of Stairs: 1  Wheelchair  Mobility Wheelchair Mobility: No    Exercises     PT Diagnosis:    PT Problem List:   PT Treatment Interventions:     PT Goals Acute Rehab PT Goals PT Goal Formulation: With patient/family Time For Goal Achievement: 04/05/12 Potential to Achieve Goals: Good PT Goal: Sit to Stand - Progress: Progressing toward goal PT Goal: Stand to Sit - Progress: Progressing toward goal PT Goal: Ambulate - Progress: Progressing toward goal PT Goal: Up/Down Stairs - Progress: Progressing toward goal  Visit Information  Last PT Received On: 03/30/12 Assistance Needed: +1    Subjective Data  Subjective: "I may go home tomorrow.l" Patient Stated Goal: Go home.   Cognition  Overall Cognitive Status: Appears within functional limits for tasks assessed/performed Arousal/Alertness: Awake/alert Orientation Level: Appears intact for tasks assessed Behavior During Session: [facility] for tasks performed    Balance  Balance Balance Assessed: No  End of Session PT - End of Session Equipment Utilized During Treatment: Gait belt;Back brace Activity Tolerance: Patient tolerated treatment well Patient left: in chair;with call bell/phone within reach Nurse Communication: Mobility status   GP     Cephus Shelling 03/30/2012, 8:31 AM  03/30/2012 Cephus Shelling, PT, DPT [phone]

## 2012-03-30 NOTE — Progress Notes (Signed)
OT Cancellation Note  Treatment cancelled today due to patient's refusal to participate.  Pt politely declining session due to company in room.  Will re-attempt visit in AM.  03/30/2012 Cipriano Mile OTR/L Pager [phone] Office [phone]

## 2012-03-30 NOTE — Care Management Note (Signed)
    Page 1 of 2   04/03/2012     4:08:50 PM   CARE MANAGEMENT NOTE 04/03/2012  Patient:  William [name], William [name]   Account Number:  [id number]  Date Initiated:  03/30/2012  Documentation initiated by:  [facility]  Subjective/Objective Assessment:   Admitted postop L4-5 PLIF.Lives with spouse.     Action/Plan:   PT eval- recommending HHPT  OT eval-recommending HHOT   Anticipated DC Date:  04/03/2012   Anticipated DC Plan:  HOME W HOME HEALTH SERVICES      DC Planning Services  CM consult      Choice offered to / List presented to:  C-1 Patient   DME arranged  Levan Hurst      DME agency  [facility]     [facility] arranged  HH-1 RN  HH-2 PT  HH-3 OT      [facility] agency  [facility]   Status of service:  Completed, signed off Medicare Important Message given?   (If response is "NO", the following Medicare IM given date fields will be blank) Date Medicare IM given:   Date Additional Medicare IM given:    Discharge Disposition:  HOME W HOME HEALTH SERVICES  Per UR Regulation:  Reviewed for med. necessity/level of care/duration of stay  If discussed at Long Length of Stay Meetings, dates discussed:    Comments:  04/03/12 Orders received for HHPT. OT and RN. Contacted Debbie at [place] and requested [facility] as well as HHPT, and OT. Patient already has rolling walker and tub bench delivered to his room.No other d/c needs identified. Jacquelynn Cree RN,BSN, CCM   03/30/12 PT and OT recommending HHPT and HHOT. Spoke with patient and his wife about HHC. They selected [facility] from the [facility] list of [facility] agencies. They have worked with them previously.PT and OT also recommending rolling walker and tub bench, patient agreeable to getting them from [facility], orders pending. Contacted Debbie at [place] and requested HHPT and OT, orders pending ,face to face needed. Will continue to follow for d/c needs. Jacquelynn Cree RN, BSN, CCM

## 2012-03-31 NOTE — Progress Notes (Signed)
Occupational Therapy Treatment Patient Details Name: William [name] MRN: [id number] DOB: 04-02-1941 Today's Date: 03/31/2012 Time: 0272-5366 OT Time Calculation (min): 24 min  OT Assessment / Plan / Recommendation Comments on Treatment Session Practiced tub transfer today via stepping over into tub and with use of tub transfer bench.  Pt would be most safe at home with tub transfers using tub bench, and pt agrees.  Will recommend tub transfer bench for d/c home.    Follow Up Recommendations  Home health OT;Supervision/Assistance - 24 hour    Barriers to Discharge       Equipment Recommendations  Tub/shower bench    Recommendations for Other Services    Frequency Min 2X/week   Plan Discharge plan remains appropriate;Equipment recommendations need to be updated    Precautions / Restrictions Precautions Precautions: Back Precaution Booklet Issued: No Precaution Comments: Pt able to independently verbalize 3/3 back precautions. Required Braces or Orthoses: Spinal Brace Spinal Brace: Lumbar corset;Applied in sitting position Restrictions Weight Bearing Restrictions: No   Pertinent Vitals/Pain See vitals    ADL  Toilet Transfer: Simulated;Min Pension scheme manager Method: Sit to Barista: Other (comment) (chair; therapy mat in gym) Tub/Shower Transfer: Performed;Min guard Tub/Shower Transfer Method: Science writer: Counsellor Used: Back brace;Rolling walker Transfers/Ambulation Related to ADLs: Ambulated to therapy gym with RW and supervision for safety. ADL Comments: Pt performed tub transfer with side step into tub.  Pt required increased time and min guard assist for safety with stepping into tub.  Also demonstrated use of tub transfer bench to pt, and pt reports he believes this would be a better option for tub transfers at home.      OT Diagnosis:    OT Problem List:   OT Treatment Interventions:     OT  Goals ADL Goals Pt Will Transfer to Toilet: with modified independence;Ambulation;with DME;Comfort height toilet;Maintaining back safety precautions ADL Goal: Toilet Transfer - Progress: Progressing toward goals Pt Will Perform Tub/Shower Transfer: Tub transfer;with modified independence;Ambulation;with DME;Shower seat with back;Maintaining back safety precautions ADL Goal: Web designer - Progress: Progressing toward goals Additional ADL Goal #1: Pt will independently verbalize and generalize 3/3 back precautions during all ADL activity. ADL Goal: Additional Goal #1 - Progress: Progressing toward goals  Visit Information  Last OT Received On: 03/31/12 Assistance Needed: +1    Subjective Data      Prior Functioning       Cognition  Overall Cognitive Status: Appears within functional limits for tasks assessed/performed Arousal/Alertness: Awake/alert Orientation Level: Appears intact for tasks assessed Behavior During Session: [facility] for tasks performed    Mobility Transfers Transfers: Sit to Stand;Stand to Sit Sit to Stand: 4: Min guard;From chair/3-in-1;5: Supervision;With upper extremity assist;Other (comment) (therapy mat in gym) Stand to Sit: 5: Supervision;To chair/3-in-1 (therapy mat in gym) Details for Transfer Assistance: Min guard from chair in room due to stiffness and increased time to complete transfer.  Pt independently demonstrating safe hand placement.   Exercises    Balance    End of Session OT - End of Session Equipment Utilized During Treatment: Gait belt;Back brace Activity Tolerance: Patient tolerated treatment well Patient left: Other (comment) (with PT in therapy gym) Nurse Communication: Mobility status  GO    03/31/2012 Cipriano Mile OTR/L Pager [phone] Office [phone]  Cipriano Mile 03/31/2012, 8:55 AM

## 2012-03-31 NOTE — Progress Notes (Signed)
Physical Therapy Treatment Patient Details Name: William [name] MRN: [id number] DOB: 13-Aug-1941 Today's Date: 03/31/2012 Time: 4098-1191 PT Time Calculation (min): 19 min  PT Assessment / Plan / Recommendation Comments on Treatment Session  Main focus on session today was bed mobility. Patient stated and perform the correct way, just needed Min A which wife can provide. Patient very pleasant and motivated to progress with mobility    Follow Up Recommendations  Home health PT    Barriers to Discharge        Equipment Recommendations  Tub/shower seat    Recommendations for Other Services    Frequency Min 5X/week   Plan Discharge plan remains appropriate;Frequency remains appropriate    Precautions / Restrictions Precautions Precautions: Back Precaution Booklet Issued: No Precaution Comments: Patient able to recall all precautions without cueing. Patient has handout in his room. Able to verbalize donning and doffing of brace.  Required Braces or Orthoses: Spinal Brace Spinal Brace: Lumbar corset;Applied in sitting position Restrictions Weight Bearing Restrictions: No   Pertinent Vitals/Pain 3/10 back pain    Mobility  Bed Mobility Bed Mobility: Sit to Sidelying Left;Left Sidelying to Sit Left Sidelying to Sit: 4: Min assist;HOB flat Sit to Sidelying Left: 4: Min assist;HOB flat Details for Bed Mobility Assistance: A with LEs in and out of bed. Patient able to complete with correct technique. Patient states that wife is capable of helping him with bed mobility at home.  Transfers Sit to Stand: 4: Min guard;With upper extremity assist;From chair/3-in-1;From bed Stand to Sit: 5: Supervision;With upper extremity assist;To bed;To chair/3-in-1 Details for Transfer Assistance: Patient stood x2. No cues needed. Minguard due to slight unsteadiness with stand. Patient utilizes one hand on chair and one hand one RW technique.  Ambulation/Gait Ambulation/Gait Assistance: 5:  Supervision Ambulation Distance (Feet): 90 Feet Assistive device: Rolling walker Ambulation/Gait Assistance Details: Cues for upright posture and to relax shoulders with use of RW. Patient able to start ambulating with step through gait pattern with cueing.  Gait Pattern: Step-through pattern;Decreased stride length;Lateral trunk lean to right Gait velocity: decreased this session Wheelchair Mobility Wheelchair Mobility: No    Exercises     PT Diagnosis:    PT Problem List:   PT Treatment Interventions:     PT Goals Acute Rehab PT Goals PT Goal: Rolling Supine to Right Side - Progress: Progressing toward goal PT Goal: Rolling Supine to Left Side - Progress: Progressing toward goal PT Goal: Supine/Side to Sit - Progress: Progressing toward goal PT Goal: Sit to Supine/Side - Progress: Progressing toward goal PT Goal: Sit to Stand - Progress: Progressing toward goal PT Goal: Stand to Sit - Progress: Progressing toward goal PT Goal: Ambulate - Progress: Progressing toward goal  Visit Information  Last PT Received On: 03/31/12 Assistance Needed: +1    Subjective Data      Cognition  Overall Cognitive Status: Appears within functional limits for tasks assessed/performed Arousal/Alertness: Awake/alert Orientation Level: Appears intact for tasks assessed Behavior During Session: [facility] for tasks performed    Balance     End of Session PT - End of Session Equipment Utilized During Treatment: Back brace Activity Tolerance: Patient tolerated treatment well Patient left: in chair Nurse Communication: Mobility status   GP     Fredrich Birks 03/31/2012, 10:02 AM 03/31/2012 Fredrich Birks PTA [phone] pager [phone] office

## 2012-03-31 NOTE — Progress Notes (Signed)
Patient ID: William [name], male   DOB: May 17, 1941, 71 y.o.   MRN: [id number] Afebril. No headache. Walking with minimal pain. Dressing dry

## 2012-04-01 MED ORDER — DOCUSATE SODIUM 100 MG PO CAPS
100.0000 mg | ORAL_CAPSULE | Freq: Two times a day (BID) | ORAL | Status: DC
  Administered 2012-04-01 – 2012-04-02 (×3): 100 mg via ORAL
  Filled 2012-04-01 (×3): qty 1

## 2012-04-01 MED ORDER — BISACODYL 5 MG PO TBEC
5.0000 mg | DELAYED_RELEASE_TABLET | Freq: Every day | ORAL | Status: DC | PRN
  Administered 2012-04-02: 5 mg via ORAL
  Filled 2012-04-01 (×2): qty 1

## 2012-04-01 NOTE — Progress Notes (Signed)
Physical Therapy Treatment Patient Details Name: William [name] MRN: [id number] DOB: 1941/06/15 Today's Date: 04/01/2012 Time: 7829-5621 PT Time Calculation (min): 14 min  PT Assessment / Plan / Recommendation Comments on Treatment Session  Pt moving well.  Eager to d/c home when MD feels appropriate.   Did not perform stair training again this session but pt able to verbalize technique & states he feels comfortable with step.      Follow Up Recommendations  Home health PT    Barriers to Discharge        Equipment Recommendations       Recommendations for Other Services    Frequency Min 5X/week   Plan Discharge plan remains appropriate;Frequency remains appropriate    Precautions / Restrictions Precautions Precautions: Back Required Braces or Orthoses: Spinal Brace Spinal Brace: Lumbar corset;Applied in sitting position Restrictions Weight Bearing Restrictions: No        Mobility  Bed Mobility Bed Mobility: Not assessed Transfers Transfers: Sit to Stand;Stand to Sit Sit to Stand: 5: Supervision;With upper extremity assist;With armrests;From chair/3-in-1 Stand to Sit: 5: Supervision;With upper extremity assist;With armrests;To chair/3-in-1 Details for Transfer Assistance: Supervision for safety.  Pt demo'd proper hand placement & technique.  Does well with ensuring balance before initiating ambulation.  Ambulation/Gait Ambulation/Gait Assistance: 5: Supervision Ambulation Distance (Feet): 200 Feet Assistive device: Rolling walker Ambulation/Gait Assistance Details: Cues for tall posture & to stay inside RW.    Gait Pattern: Step-through pattern;Decreased stride length;Right flexed knee in stance;Left flexed knee in stance;Trunk flexed Wheelchair Mobility Wheelchair Mobility: No     PT Goals Acute Rehab PT Goals Time For Goal Achievement: 04/05/12 PT Goal: Sit to Stand - Progress: Progressing toward goal PT Goal: Stand to Sit - Progress: Progressing toward goal PT  Goal: Ambulate - Progress: Progressing toward goal  Visit Information  Last PT Received On: 04/01/12 Assistance Needed: +1    Subjective Data      Cognition  Overall Cognitive Status: Appears within functional limits for tasks assessed/performed Arousal/Alertness: Awake/alert Orientation Level: Appears intact for tasks assessed Behavior During Session: [facility] for tasks performed    Balance     End of Session PT - End of Session Equipment Utilized During Treatment: Back brace Activity Tolerance: Patient tolerated treatment well Patient left: in chair Nurse Communication: Mobility status    Verdell Face, [place] [phone] 04/01/2012

## 2012-04-01 NOTE — Progress Notes (Signed)
Patient ID: William [name], male   DOB: February 22, 1941, 71 y.o.   MRN: [id number] No fever. Ambulating with minimal pain. Wound healing well. Decrease of drainage. Dressing changed. Discharge in am?

## 2012-04-02 NOTE — Progress Notes (Signed)
Physical Therapy Treatment Patient Details Name: William [name] MRN: [id number] DOB: [birth year] Today's Date: 04/02/2012 Time: 0800-0827 PT Time Calculation (min): 27 min  PT Assessment / Plan / Recommendation Comments on Treatment Session  continuing to move well; OK for dc home from PT standpoint    Follow Up Recommendations  Home health PT    Barriers to Discharge        Equipment Recommendations   (Rw was delivered to room)    Recommendations for Other Services    Frequency Min 5X/week   Plan Discharge plan remains appropriate;Frequency remains appropriate    Precautions / Restrictions Precautions Precautions: Back Precaution Booklet Issued: No Precaution Comments: Patient able to recall all precautions without cueing. Patient has handout in his room. Able to verbalize donning and doffing of brace.  Required Braces or Orthoses: Spinal Brace Spinal Brace: Lumbar corset;Applied in sitting position Restrictions Weight Bearing Restrictions: No   Pertinent Vitals/Pain 5/10 back pain initially; had been premedicated for pain; reports ambulating helps pain     Mobility  Bed Mobility Bed Mobility: Sit to Sidelying Left;Left Sidelying to Sit;Rolling Right;Rolling Left Rolling Right: 4: Min guard (Left sidelie to supine) Rolling Left: 5: Supervision Left Sidelying to Sit: 4: Min guard;HOB flat Sit to Sidelying Left: 4: Min assist;HOB flat Details for Bed Mobility Assistance: continues to need Min A to help LEs into bed; otherwise good technique Transfers Transfers: Sit to Stand;Stand to Sit Sit to Stand: 5: Supervision;With upper extremity assist;With armrests;From chair/3-in-1 Stand to Sit: 5: Supervision;With upper extremity assist;With armrests;To chair/3-in-1 Details for Transfer Assistance: Supervision for safety.  Pt demo'd proper hand placement & technique.  Does well with ensuring balance before initiating ambulation.  Ambulation/Gait Ambulation/Gait Assistance: 5:  Supervision Ambulation Distance (Feet): 200 Feet Assistive device: Rolling walker Ambulation/Gait Assistance Details: Occasional cues for posture and smoothing out progression of RW Stairs Assistance Details (indicate cue type and reason): Pt did not have questions about stair negotiation; is confident he and wife will be able to manage    Exercises     PT Diagnosis:    PT Problem List:   PT Treatment Interventions:     PT Goals Acute Rehab PT Goals Time For Goal Achievement: 04/05/12 Potential to Achieve Goals: Good Pt will Roll Supine to Left Side: with modified independence PT Goal: Rolling Supine to Left Side - Progress: Progressing toward goal Pt will go Supine/Side to Sit: with modified independence PT Goal: Supine/Side to Sit - Progress: Progressing toward goal Pt will go Sit to Supine/Side: with modified independence PT Goal: Sit to Supine/Side - Progress: Progressing toward goal Pt will go Sit to Stand: with modified independence PT Goal: Sit to Stand - Progress: Progressing toward goal Pt will go Stand to Sit: with modified independence PT Goal: Stand to Sit - Progress: Progressing toward goal Pt will Ambulate: >150 feet;with modified independence;with least restrictive assistive device PT Goal: Ambulate - Progress: Progressing toward goal  Visit Information  Last PT Received On: 04/02/12 Assistance Needed: +1    Subjective Data  Subjective: Hoping to go home today Patient Stated Goal: Go home.   Cognition  Overall Cognitive Status: Appears within functional limits for tasks assessed/performed Arousal/Alertness: Awake/alert Orientation Level: Appears intact for tasks assessed Behavior During Session: [facility] for tasks performed    Balance     End of Session PT - End of Session Equipment Utilized During Treatment: Back brace Activity Tolerance: Patient tolerated treatment well Patient left: in chair Nurse Communication: Mobility status  GP     Van Clines [place], [place] [phone]  04/02/2012, 9:33 AM

## 2012-04-02 NOTE — Progress Notes (Signed)
Patient ID: William [name], male   DOB: 1940/12/31, 71 y.o.   MRN: [id number] Temp max 99.4. Decrease of drainage. No headache. C/o incisional pain, no leg pain. No bm since surgery. Spoke with wike. Most likely to be discharge in am.

## 2012-04-03 NOTE — Discharge Summary (Signed)
Physician Discharge Summary  Patient ID: William [name] MRN: [id number] DOB/AGE: August 17, 1941 71 y.o.  Admit date: 03/28/2012 Discharge date: 04/03/2012  Admission Diagnoses:lumbar stenosis, spondylolisthesis  Discharge Diagnoses: same   Discharged Condition:no pain. Minimal drainage, no fever, no headache  Hospital Course: lumbar fusion  Consults: none  Significant Diagnostic Studies: myelogram  Treatments: surgery  Discharge Exam: Blood pressure 142/66, pulse 73, temperature 98.3 F (36.8 C), temperature source Oral, resp. rate 20, height 5\' 9"  (1.753 m), weight 91.581 kg (201 lb 14.4 oz), SpO2 97.00%. No longer pain while walking. No weakness  Disposition:    Medication List  As of 04/03/2012  9:36 AM   ASK your doctor about these medications         aspirin EC 81 MG tablet   Take 81 mg by mouth daily.      cetirizine 10 MG tablet   Commonly known as: ZYRTEC   Take 10 mg by mouth daily as needed. For allergies      Fish Oil 1200 MG Caps   Take 2 capsules by mouth daily.      latanoprost 0.005 % ophthalmic solution   Commonly known as: XALATAN   Place 1 drop into both eyes at bedtime.      Lysine 500 MG Caps   Take 1 capsule by mouth daily.      multivitamin with minerals Tabs   Take 1 tablet by mouth daily.      naproxen sodium 220 MG tablet   Commonly known as: ANAPROX   Take 220 mg by mouth 2 (two) times daily with a meal.      simvastatin 40 MG tablet   Commonly known as: ZOCOR   Take 40 mg by mouth at bedtime.             Signed: Karn Cassis 04/03/2012, 9:36 AM

## 2012-04-03 NOTE — Progress Notes (Signed)
Physical Therapy Treatment Patient Details Name: William [name] MRN: [id number] DOB: 12/16/1940 Today's Date: 04/03/2012 Time: 4098-1191 PT Time Calculation (min): 29 min  PT Assessment / Plan / Recommendation Comments on Treatment Session  William [name] has made great progress with therapies. OK to d/c home from PT standpoint.     Follow Up Recommendations  Home health PT    Barriers to Discharge        Equipment Recommendations       Recommendations for Other Services    Frequency     Plan Discharge plan remains appropriate;Frequency remains appropriate    Precautions / Restrictions Precautions Precautions: Back Spinal Brace: Lumbar corset;Applied in sitting position       Mobility  Bed Mobility Left Sidelying to Sit: 4: Min guard;HOB flat Sit to Sidelying Left: 4: Min assist Details for Bed Mobility Assistance: min tactile assist to lift LE to bed, demonstrated/educated wife on body mechanics when assisting lower extremities to bed, cues to avoid twisting intially when performing sit->sidelying Transfers Transfers: Sit to Stand;Stand to Sit Sit to Stand: 5: Supervision;With upper extremity assist;From chair/3-in-1;With armrests;From bed Stand to Sit: 5: Supervision;With upper extremity assist;To bed;To chair/3-in-1 Details for Transfer Assistance: verbal cues x2 for safe hand placement and cues for decreased bending during transitions Ambulation/Gait Ambulation/Gait Assistance: 5: Supervision Ambulation Distance (Feet): 400 Feet Assistive device: Rolling walker Ambulation/Gait Assistance Details: initially pt very segmental with step to gait patter needing cues for safe distancing with RW, after about 100 ft pt able to smooth gait with step through pattern needing occasional cues for upright posture and forward gaze Gait Pattern: Decreased stride length;Trunk flexed    Exercises     PT Goals Acute Rehab PT Goals PT Goal: Supine/Side to Sit - Progress: Progressing  toward goal PT Goal: Sit to Supine/Side - Progress: Progressing toward goal PT Goal: Sit to Stand - Progress: Progressing toward goal PT Goal: Stand to Sit - Progress: Progressing toward goal PT Goal: Ambulate - Progress: Progressing toward goal  Visit Information  Last PT Received On: 04/03/12 Assistance Needed: +1    Subjective Data  Subjective: Walking really seems to help.    Cognition  Overall Cognitive Status: Appears within functional limits for tasks assessed/performed Arousal/Alertness: Awake/alert Orientation Level: Appears intact for tasks assessed Behavior During Session: [facility] for tasks performed    Balance     End of Session PT - End of Session Equipment Utilized During Treatment: Back brace Activity Tolerance: Patient tolerated treatment well Patient left: in chair;with call bell/phone within reach;with family/visitor present   GP     [facility] HELEN 04/03/2012, 8:48 AM

## 2012-07-20 ENCOUNTER — Encounter (INDEPENDENT_AMBULATORY_CARE_PROVIDER_SITE_OTHER): Payer: Self-pay | Admitting: *Deleted

## 2012-08-03 ENCOUNTER — Telehealth (INDEPENDENT_AMBULATORY_CARE_PROVIDER_SITE_OTHER): Payer: Self-pay | Admitting: *Deleted

## 2012-08-03 ENCOUNTER — Encounter (INDEPENDENT_AMBULATORY_CARE_PROVIDER_SITE_OTHER): Payer: Self-pay | Admitting: *Deleted

## 2012-08-03 ENCOUNTER — Other Ambulatory Visit (INDEPENDENT_AMBULATORY_CARE_PROVIDER_SITE_OTHER): Payer: Self-pay | Admitting: *Deleted

## 2012-08-03 DIAGNOSIS — Z1211 Encounter for screening for malignant neoplasm of colon: Secondary | ICD-10-CM

## 2012-08-03 MED ORDER — PEG-KCL-NACL-NASULF-NA ASC-C 100 G PO SOLR: 1.0000 | Freq: Once | ORAL | Status: DC

## 2012-08-03 NOTE — Telephone Encounter (Signed)
Patient needs movi prep 

## 2012-09-07 ENCOUNTER — Telehealth (INDEPENDENT_AMBULATORY_CARE_PROVIDER_SITE_OTHER): Payer: Self-pay | Admitting: *Deleted

## 2012-09-07 NOTE — Telephone Encounter (Signed)
  Procedure: tcs  Reason/Indication:  screening  Has patient had this procedure before?  yes  If so, when, by whom and where?  10 yrs ago  Is there a family history of colon cancer?  no  Who?  What age when diagnosed?    Is patient diabetic?   Borderline, diet control      Does patient have prosthetic heart valve?  no  Do you have a pacemaker?  no  Has patient had joint replacement within last 12 months?  no  Is patient on Coumadin, Plavix and/or Aspirin? yes  Medications: asa 81 mg daily, multi vit, fish oil 1200 mg tid, lysine 500 mg daily, simvastatin 40 mg daily, meloxicam 15 mg daily, tramadol 50 mg every 6 -8 hrs prn, zyrtec 10 mg prn, latanoprost eye drop each eye @ bedtime  Allergies: nkda  Medication Adjustment: asa 2 days  Procedure date & time: 10/05/12 at 830

## 2012-09-07 NOTE — Telephone Encounter (Signed)
agree

## 2012-09-20 DIAGNOSIS — C801 Malignant (primary) neoplasm, unspecified: Secondary | ICD-10-CM

## 2012-09-20 HISTORY — DX: Malignant (primary) neoplasm, unspecified: C80.1

## 2012-09-21 ENCOUNTER — Encounter (HOSPITAL_COMMUNITY): Payer: Self-pay | Admitting: Pharmacy Technician

## 2012-10-05 ENCOUNTER — Ambulatory Visit (HOSPITAL_COMMUNITY)
Admission: RE | Admit: 2012-10-05 | Discharge: 2012-10-05 | Disposition: A | Discharge status: Home / Self Care | Payer: Medicare Other | Source: Ambulatory Visit | Attending: Internal Medicine | Admitting: Internal Medicine

## 2012-10-05 ENCOUNTER — Encounter (HOSPITAL_COMMUNITY)
Admission: RE | Disposition: A | Discharge status: Home / Self Care | Payer: Self-pay | Source: Ambulatory Visit | Attending: Internal Medicine

## 2012-10-05 ENCOUNTER — Encounter (HOSPITAL_COMMUNITY): Payer: Self-pay | Admitting: *Deleted

## 2012-10-05 DIAGNOSIS — Z1211 Encounter for screening for malignant neoplasm of colon: Secondary | ICD-10-CM

## 2012-10-05 DIAGNOSIS — K644 Residual hemorrhoidal skin tags: Secondary | ICD-10-CM | POA: Insufficient documentation

## 2012-10-05 DIAGNOSIS — K6389 Other specified diseases of intestine: Secondary | ICD-10-CM

## 2012-10-05 DIAGNOSIS — E119 Type 2 diabetes mellitus without complications: Secondary | ICD-10-CM | POA: Insufficient documentation

## 2012-10-05 HISTORY — PX: COLONOSCOPY: SHX5424

## 2012-10-05 SURGERY — COLONOSCOPY
Anesthesia: Moderate Sedation

## 2012-10-05 MED ORDER — STERILE WATER FOR IRRIGATION IR SOLN
Status: DC | PRN
  Administered 2012-10-05: 08:00:00

## 2012-10-05 MED ORDER — MIDAZOLAM HCL 5 MG/5ML IJ SOLN
INTRAMUSCULAR | Status: AC
  Filled 2012-10-05: qty 10

## 2012-10-05 MED ORDER — MEPERIDINE HCL 50 MG/ML IJ SOLN
INTRAMUSCULAR | Status: DC | PRN
  Administered 2012-10-05 (×2): 25 mg via INTRAVENOUS

## 2012-10-05 MED ORDER — SODIUM CHLORIDE 0.45 % IV SOLN
INTRAVENOUS | Status: DC
  Administered 2012-10-05: 1000 mL via INTRAVENOUS

## 2012-10-05 MED ORDER — MEPERIDINE HCL 50 MG/ML IJ SOLN
INTRAMUSCULAR | Status: AC
  Filled 2012-10-05: qty 1

## 2012-10-05 MED ORDER — MIDAZOLAM HCL 5 MG/5ML IJ SOLN
INTRAMUSCULAR | Status: DC | PRN
  Administered 2012-10-05 (×3): 2 mg via INTRAVENOUS

## 2012-10-05 NOTE — Op Note (Signed)
COLONOSCOPY PROCEDURE REPORT  PATIENT:  William [name]  MR#:  [id number] Birthdate:  [birth year], 72 y.o., male Endoscopist:  Dr. Malissa Hippo, MD Referred By:  Dr. Ignatius Specking, MD Procedure Date: 10/05/2012  Procedure:   Colonoscopy  Indications: Patient is 72 year old Caucasian male who is here for average risk screening colonoscopy. His last exam was in 2003.  Informed Consent:  The procedure and risks were reviewed with the patient and informed consent was obtained.  Medications:  Demerol 50 mg IV Versed 6 mg IV  Description of procedure:  After a digital rectal exam was performed, that colonoscope was advanced from the anus through the rectum and colon to the area of the cecum, ileocecal valve and appendiceal orifice. The cecum was deeply intubated. These structures were well-seen and photographed for the record. From the level of the cecum and ileocecal valve, the scope was slowly and cautiously withdrawn. The mucosal surfaces were carefully surveyed utilizing scope tip to flexion to facilitate fold flattening as needed. The scope was pulled down into the rectum where a thorough exam including retroflexion was performed.  Findings:   Prep satisfactory. Normal mucosa of the colon and rectum. Small hemorrhoids below the dentate line and single anal papilla.  Therapeutic/Diagnostic Maneuvers Performed:  None  Complications:  None  Cecal Withdrawal Time:  11 minutes  Impression:  Normal colonoscopy except small external hemorrhoids and single anal papilla.  Recommendations:  Standard instructions given. Next screening exam in 10 years.  REHMAN,NAJEEB U  10/05/2012 8:55 AM  CC: Dr. Ignatius Specking., MD & Dr. Bonnetta Barry ref. provider found

## 2012-10-05 NOTE — H&P (Addendum)
William [name] is an 72 y.o. male.   Chief Complaint: Patient is here for colonoscopy. HPI: Patient is 72 year old Caucasian male who is here for average risk screening colonoscopy. Patient's last exam was in 2003. He denies abdominal pain change in his bowel habits or rectal bleeding. Family history is negative for colorectal carcinoma.  Past Medical History  Diagnosis Date  . Hyperlipemia   . Diabetes mellitus     borderline controlled with diet  . Arthritis   . Glaucoma (increased eye pressure)     bilaterally  . Hearing difficulty     "has ringing in left ear"  . Neuromuscular disorder     numbness/tingling lower extremeties  . Cancer     skin ca, basal cell    Past Surgical History  Procedure Date  . Appendectomy   . Tonsillectomy   . Total hip arthroplasty     x 2  . Partial hip arthroplasty     x 1  . Back surgery     low back surgery right side 1997  . Skin cancer excision   . Joint replacement     History reviewed. No pertinent family history. Social History:  reports that he has never smoked. He does not have any smokeless tobacco history on file. He reports that he does not drink alcohol or use illicit drugs.  Allergies: No Known Allergies  Medications Prior to Admission  Medication Sig Dispense Refill  . aspirin EC 81 MG tablet Take 81 mg by mouth daily.      . cetirizine (ZYRTEC) 10 MG tablet Take 10 mg by mouth daily as needed. For allergies      . latanoprost (XALATAN) 0.005 % ophthalmic solution Place 1 drop into both eyes at bedtime.      Marland Kitchen Lysine 500 MG CAPS Take 1 capsule by mouth daily.      . meloxicam (MOBIC) 15 MG tablet Take 15 mg by mouth daily.      . Multiple Vitamin (MULTIVITAMIN WITH MINERALS) TABS Take 1 tablet by mouth daily.      . naproxen sodium (ANAPROX) 220 MG tablet Take 220 mg by mouth 2 (two) times daily with a meal.      . Omega-3 Fatty Acids (FISH OIL) 1200 MG CAPS Take 2 capsules by mouth daily.      . peg 3350 powder  (MOVIPREP) 100 G SOLR Take 1 kit (100 g total) by mouth once.  1 kit  0  . simvastatin (ZOCOR) 40 MG tablet Take 40 mg by mouth at bedtime.      . traMADol (ULTRAM) 50 MG tablet Take 50 mg by mouth every 6 (six) hours as needed. Pain.        No results found for this or any previous visit (from the past 48 hour(s)). No results found.  ROS  Blood pressure 165/83, pulse 82, temperature 97.7 F (36.5 C), temperature source Oral, resp. rate 23, height 5\' 11"  (1.803 m), weight 193 lb (87.544 kg), SpO2 100.00%. Physical Exam  Constitutional: He appears well-developed and well-nourished.  HENT:  Mouth/Throat: Oropharynx is clear and moist.  Eyes: Conjunctivae normal are normal. No scleral icterus.  Neck: No thyromegaly present.  Cardiovascular: Normal rate, regular rhythm and normal heart sounds.   No murmur heard. Respiratory: Effort normal.       pectum excavatum  GI: Soft. He exhibits no distension and no mass. There is no tenderness.       Appendectomy scar in RLQ  Musculoskeletal: He exhibits no edema.  Lymphadenopathy:    He has no cervical adenopathy.  Neurological: He is alert.  Skin: Skin is warm and dry.     Assessment/Plan Average risk screening colonoscopy.  Oline Belk U 10/05/2012, 8:25 AM

## 2012-10-09 ENCOUNTER — Encounter (HOSPITAL_COMMUNITY): Payer: Self-pay | Admitting: Internal Medicine

## 2014-03-27 ENCOUNTER — Other Ambulatory Visit (HOSPITAL_COMMUNITY): Payer: Self-pay | Admitting: Orthopedic Surgery

## 2014-03-27 DIAGNOSIS — Z96649 Presence of unspecified artificial hip joint: Principal | ICD-10-CM

## 2014-03-27 DIAGNOSIS — T84038A Mechanical loosening of other internal prosthetic joint, initial encounter: Secondary | ICD-10-CM

## 2014-03-29 ENCOUNTER — Encounter (HOSPITAL_COMMUNITY)
Admission: RE | Admit: 2014-03-29 | Discharge: 2014-03-29 | Disposition: A | Discharge status: Home / Self Care | Payer: Medicare Other | Source: Ambulatory Visit | Attending: Orthopedic Surgery | Admitting: Orthopedic Surgery

## 2014-03-29 ENCOUNTER — Encounter (HOSPITAL_COMMUNITY): Payer: Self-pay

## 2014-03-29 DIAGNOSIS — Z96649 Presence of unspecified artificial hip joint: Secondary | ICD-10-CM | POA: Diagnosis present

## 2014-03-29 DIAGNOSIS — M25559 Pain in unspecified hip: Secondary | ICD-10-CM | POA: Insufficient documentation

## 2014-03-29 DIAGNOSIS — W19XXXA Unspecified fall, initial encounter: Secondary | ICD-10-CM | POA: Insufficient documentation

## 2014-03-29 DIAGNOSIS — T84039A Mechanical loosening of unspecified internal prosthetic joint, initial encounter: Secondary | ICD-10-CM | POA: Diagnosis not present

## 2014-03-29 DIAGNOSIS — T84038A Mechanical loosening of other internal prosthetic joint, initial encounter: Secondary | ICD-10-CM

## 2014-03-29 MED ORDER — TECHNETIUM TC 99M MEDRONATE IV KIT
25.0000 | PACK | Freq: Once | INTRAVENOUS | Status: AC | PRN
  Administered 2014-03-29: 25 via INTRAVENOUS

## 2014-05-17 ENCOUNTER — Encounter (HOSPITAL_COMMUNITY): Payer: Self-pay | Admitting: Pharmacy Technician

## 2014-05-19 NOTE — H&P (Signed)
TOTAL HIP REVISION ADMISSION H&P  Patient is admitted for left revision total hip arthroplasty.  Subjective:  Chief Complaint:     Left hip pain S/P left THA  HPI: William [name], 73 y.o. 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 The indications for the revision total hip arthroplasty are loosening of one or more components and failure of acetabular component.  Onset of symptoms was gradual starting years ago with gradually worsening course since that time.  Prior procedures on the left hip include arthroplasty.  Patient currently rates pain in the left hip at 10 out of 10 with activity.  There is night pain, worsening of pain with activity and weight bearing, trendelenberg gait, pain that interfers with activities of daily living and pain with passive range of motion. Patient has evidence of failure of acetabular component, with protrusion in to the pelvis by imaging studies.  This condition presents safety issues increasing the risk of falls.  There is no current active infection.  Risks, benefits and expectations were discussed with the patient.  Risks including but not limited to the risk of anesthesia, blood clots, nerve damage, blood vessel damage, failure of the prosthesis, infection and up to and including death.  Patient understand the risks, benefits and expectations and wishes to proceed with surgery.   PCP: Glenda Chroman., MD  D/C Plans:      Home with HHPT/SNF  Post-op Meds:       No Rx given   Tranexamic Acid:      To be given - IV    Decadron:      Is to be given  FYI:     ASA post-op  Norco post-op    Past Medical History  Diagnosis Date  . Hyperlipemia   . Diabetes mellitus     borderline controlled with  diet  . Arthritis   . Glaucoma (increased eye pressure)     bilaterally  . Hearing difficulty     "has ringing in left ear"  . Neuromuscular disorder     numbness/tingling lower extremeties  . Cancer     skin ca, basal cell    Past Surgical History  Procedure Laterality Date  . Appendectomy    . Tonsillectomy    . Total hip arthroplasty      x 2  . Partial hip arthroplasty      x 1  . Back surgery      low back surgery right side 1997  . Skin cancer excision    . Joint replacement    . Colonoscopy  10/05/2012    Procedure: COLONOSCOPY;  Surgeon: Rogene Houston, MD;  Location: [facility];  Service: Endoscopy;  Laterality: N/A;  830    No prescriptions prior to admission   No Known Allergies   History  Substance Use Topics  . Smoking status: Never Smoker   . Smokeless tobacco: Not on file  . Alcohol Use: No    No family history on file.    Review of Systems  Constitutional: Negative.   HENT: Positive for hearing loss and tinnitus.   Eyes: Negative.   Respiratory: Negative.   Cardiovascular: Negative.   Gastrointestinal: Positive for constipation.  Genitourinary: Negative.   Musculoskeletal: Positive for joint pain.  Skin: Negative.   Neurological: Negative.   Endo/Heme/Allergies: Negative.   Psychiatric/Behavioral: Negative.     Objective:  Physical Exam  Constitutional: He is oriented to person, place, and time. He appears well-developed and well-nourished.  HENT:  Head: Normocephalic and atraumatic.  Eyes: Pupils are equal, round, and reactive to light.  Neck: Neck supple. No JVD present. No tracheal deviation present. No thyromegaly present.  Cardiovascular: Normal rate, regular rhythm, normal heart sounds and intact distal pulses.   Respiratory: Effort normal and breath sounds normal. No stridor. No respiratory distress. He has no wheezes.  GI: Soft. There is no tenderness. There is no guarding.  Musculoskeletal:       Left hip: He exhibits  decreased range of motion, decreased strength, tenderness, bony tenderness, deformity and laceration (previous incision healed). He exhibits no swelling and no crepitus.  Lymphadenopathy:    He has no cervical adenopathy.  Neurological: He is alert and oriented to person, place, and time. A sensory deficit (bilateral legs / feet) is present.  Skin: Skin is warm and dry.  Psychiatric: He has a normal mood and affect.      Labs:  Estimated body mass index is 26.93 kg/(m^2) as calculated from the following:   Height as of 10/05/12: 5\' 11"  (1.803 m).   Weight as of 10/05/12: 87.544 kg (193 lb).  Imaging Review:  Plain radiographs demonstrate acetabular failure  of the left hip(s). There is evidence of loosening of the acetabular cup.The bone quality appears to be poor for age and reported activity level.   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  The patient history, physical examination, clinical judgement of the provider and imaging studies are consistent with failure of the left hip(s), previous total hip arthroplasty. Revision total hip arthroplasty is deemed medically necessary. The treatment options including medical management, injection therapy, arthroscopy and arthroplasty were discussed at length. The risks and benefits of total hip arthroplasty were presented and reviewed. The risks due to aseptic loosening, infection, stiffness, dislocation/subluxation,  thromboembolic complications and other imponderables were discussed.  The patient acknowledged the explanation, agreed to proceed with the plan and consent was signed. Patient is being admitted for inpatient treatment for surgery, pain control, PT, OT, prophylactic antibiotics, VTE prophylaxis, progressive ambulation and ADL's and discharge planning. The patient is planning to be discharged to skilled nursing facility / home.     [place] Shane Badeaux   PA-C  05/19/2014, 8:12 PM

## 2014-05-21 ENCOUNTER — Other Ambulatory Visit (HOSPITAL_COMMUNITY): Payer: Self-pay | Admitting: Orthopedic Surgery

## 2014-05-21 NOTE — Patient Instructions (Addendum)
20 William [name]  05/21/2014   Your procedure is scheduled on: Monday September 14th, 2015  Report to [facility] Main Entrance and follow signs to  [place] at  125 PM  Call this number if you have problems the morning of surgery [phone]   Remember: short stay will draw your blood type morning of surgery.  Do not eat food  :After Midnight.   clear liquids midnight until 925 am day of surgery, nothing by mouth after 925 am day of surgery.   Take these medicines the morning of surgery with A SIP OF WATER: Atorvastatin, Zyrtec if needed, Gabapentin                               You may not have any metal on your body including hair pins and piercings  Do not wear jewelry, make-up, lotions, powders, or deodorant.   Men may shave face and neck.  Do not bring valuables to the hospital. [place].  Contacts, dentures or bridgework may not be worn into surgery.  Leave suitcase in the car. After surgery it may be brought to your room.  For patients admitted to the hospital, checkout time is 11:00 AM the day of discharge.    ________________________________________________________________________  [facility] - Preparing for Surgery Before surgery, you can play an important role.  Because skin is not sterile, your skin needs to be as free of germs as possible.  You can reduce the number of germs on your skin by washing with CHG (chlorahexidine gluconate) soap before surgery.  CHG is an antiseptic cleaner which kills germs and bonds with the skin to continue killing germs even after washing. Please DO NOT use if you have an allergy to CHG or antibacterial soaps.  If your skin becomes reddened/irritated stop using the CHG and inform your nurse when you arrive at Short Stay. Do not shave (including legs and underarms) for at least 48 hours prior to the first CHG shower.  You may shave your face/neck. Please follow these instructions  carefully:  1.  Shower with CHG Soap the night before surgery and the  morning of Surgery.  2.  If you choose to wash your hair, wash your hair first as usual with your  normal  shampoo.  3.  After you shampoo, rinse your hair and body thoroughly to remove the  shampoo.                           4.  Use CHG as you would any other liquid soap.  You can apply chg directly  to the skin and wash                       Gently with a scrungie or clean washcloth.  5.  Apply the CHG Soap to your body ONLY FROM THE NECK DOWN.   Do not use on face/ open                           Wound or open sores. Avoid contact with eyes, ears mouth and genitals (private parts).                       Wash face,  Genitals (private parts) with your normal soap.  6.  Wash thoroughly, paying special attention to the area where your surgery  will be performed.  7.  Thoroughly rinse your body with warm water from the neck down.  8.  DO NOT shower/wash with your normal soap after using and rinsing off  the CHG Soap.                9.  Pat yourself dry with a clean towel.            10.  Wear clean pajamas.            11.  Place clean sheets on your bed the night of your first shower and do not  sleep with pets. Day of Surgery : Do not apply any lotions/deodorants the morning of surgery.  Please wear clean clothes to the hospital/surgery center.  FAILURE TO FOLLOW THESE INSTRUCTIONS MAY RESULT IN THE CANCELLATION OF YOUR SURGERY PATIENT SIGNATURE_________________________________  NURSE SIGNATURE__________________________________  ________________________________________________________________________    CLEAR LIQUID DIET   Foods Allowed                                                                     Foods Excluded  Coffee and tea, regular and decaf                             liquids that you cannot  Plain Jell-O in any flavor                                             see through such as: Fruit ices  (not with fruit pulp)                                     milk, soups, orange juice  Iced Popsicles                                    All solid food Carbonated beverages, regular and diet                                    Cranberry, grape and apple juices Sports drinks like Gatorade Lightly seasoned clear broth or consume(fat free) Sugar, honey syrup  Sample Menu Breakfast                                Lunch                                     Supper Cranberry juice                    Beef broth  Chicken broth Jell-O                                     Grape juice                           Apple juice Coffee or tea                        Jell-O                                      Popsicle                                                Coffee or tea                        Coffee or tea  _____________________________________________________________________    Incentive Spirometer  An incentive spirometer is a tool that can help keep your lungs clear and active. This tool measures how well you are filling your lungs with each breath. Taking long deep breaths may help reverse or decrease the chance of developing breathing (pulmonary) problems (especially infection) following:  A long period of time when you are unable to move or be active. BEFORE THE PROCEDURE   If the spirometer includes an indicator to show your best effort, your nurse or respiratory therapist will set it to a desired goal.  If possible, sit up straight or lean slightly forward. Try not to slouch.  Hold the incentive spirometer in an upright position. INSTRUCTIONS FOR USE  1. Sit on the edge of your bed if possible, or sit up as far as you can in bed or on a chair. 2. Hold the incentive spirometer in an upright position. 3. Breathe out normally. 4. Place the mouthpiece in your mouth and seal your lips tightly around it. 5. Breathe in slowly and as deeply as possible, raising the piston  or the ball toward the top of the column. 6. Hold your breath for 3-5 seconds or for as long as possible. Allow the piston or ball to fall to the bottom of the column. 7. Remove the mouthpiece from your mouth and breathe out normally. 8. Rest for a few seconds and repeat Steps 1 through 7 at least 10 times every 1-2 hours when you are awake. Take your time and take a few normal breaths between deep breaths. 9. The spirometer may include an indicator to show your best effort. Use the indicator as a goal to work toward during each repetition. 10. After each set of 10 deep breaths, practice coughing to be sure your lungs are clear. If you have an incision (the cut made at the time of surgery), support your incision when coughing by placing a pillow or rolled up towels firmly against it. Once you are able to get out of bed, walk around indoors and cough well. You may stop using the incentive spirometer when instructed by your caregiver.  RISKS AND COMPLICATIONS  Take your time so you do not get dizzy or light-headed.  If you are in pain, you  may need to take or ask for pain medication before doing incentive spirometry. It is harder to take a deep breath if you are having pain. AFTER USE  Rest and breathe slowly and easily.  It can be helpful to keep track of a log of your progress. Your caregiver can provide you with a simple table to help with this. If you are using the spirometer at home, follow these instructions: [place] IF:   You are having difficultly using the spirometer.  You have trouble using the spirometer as often as instructed.  Your pain medication is not giving enough relief while using the spirometer.  You develop fever of 100.5 F (38.1 C) or higher. SEEK IMMEDIATE MEDICAL CARE IF:   You cough up bloody sputum that had not been present before.  You develop fever of 102 F (38.9 C) or greater.  You develop worsening pain at or near the incision site. MAKE  SURE YOU:   Understand these instructions.  Will watch your condition.  Will get help right away if you are not doing well or get worse. Document Released: 01/17/2007 Document Revised: 11/29/2011 Document Reviewed: 03/20/2007 ExitCare Patient Information 2014 Exit[employer], [place].   ________________________________________________________________________  WHAT IS A BLOOD TRANSFUSION? Blood Transfusion Information  A transfusion is the replacement of blood or some of its parts. Blood is made up of multiple cells which provide different functions.  Red blood cells carry oxygen and are used for blood loss replacement.  White blood cells fight against infection.  Platelets control bleeding.  Plasma helps clot blood.  Other blood products are available for specialized needs, such as hemophilia or other clotting disorders. BEFORE THE TRANSFUSION  Who gives blood for transfusions?   Healthy volunteers who are fully evaluated to make sure their blood is safe. This is blood bank blood. Transfusion therapy is the safest it has ever been in the practice of medicine. Before blood is taken from a donor, a complete history is taken to make sure that person has no history of diseases nor engages in risky social behavior (examples are intravenous drug use or sexual activity with multiple partners). The donor's travel history is screened to minimize risk of transmitting infections, such as malaria. The donated blood is tested for signs of infectious diseases, such as HIV and hepatitis. The blood is then tested to be sure it is compatible with you in order to minimize the chance of a transfusion reaction. If you or a relative donates blood, this is often done in anticipation of surgery and is not appropriate for emergency situations. It takes many days to process the donated blood. RISKS AND COMPLICATIONS Although transfusion therapy is very safe and saves many lives, the main dangers of transfusion  include:   Getting an infectious disease.  Developing a transfusion reaction. This is an allergic reaction to something in the blood you were given. Every precaution is taken to prevent this. The decision to have a blood transfusion has been considered carefully by your caregiver before blood is given. Blood is not given unless the benefits outweigh the risks. AFTER THE TRANSFUSION  Right after receiving a blood transfusion, you will usually feel much better and more energetic. This is especially true if your red blood cells have gotten low (anemic). The transfusion raises the level of the red blood cells which carry oxygen, and this usually causes an energy increase.  The nurse administering the transfusion will monitor you carefully for complications. HOME CARE INSTRUCTIONS  No  special instructions are needed after a transfusion. You may find your energy is better. Speak with your caregiver about any limitations on activity for underlying diseases you may have. SEEK MEDICAL CARE IF:   Your condition is not improving after your transfusion.  You develop redness or irritation at the intravenous (IV) site. SEEK IMMEDIATE MEDICAL CARE IF:  Any of the following symptoms occur over the next 12 hours:  Shaking chills.  You have a temperature by mouth above 102 F (38.9 C), not controlled by medicine.  Chest, back, or muscle pain.  People around you feel you are not acting correctly or are confused.  Shortness of breath or difficulty breathing.  Dizziness and fainting.  You get a rash or develop hives.  You have a decrease in urine output.  Your urine turns a dark color or changes to pink, red, or brown. Any of the following symptoms occur over the next 10 days:  You have a temperature by mouth above 102 F (38.9 C), not controlled by medicine.  Shortness of breath.  Weakness after normal activity.  The white part of the eye turns yellow (jaundice).  You have a decrease in  the amount of urine or are urinating less often.  Your urine turns a dark color or changes to pink, red, or brown. Document Released: 09/03/2000 Document Revised: 11/29/2011 Document Reviewed: 04/22/2008 [facility] Patient Information 2014 [place], [place].  _______________________________________________________________________

## 2014-05-21 NOTE — Progress Notes (Signed)
Medical cleaarnce note dr vyas on chart for 9-14 15 surgery

## 2014-05-22 ENCOUNTER — Encounter (HOSPITAL_COMMUNITY)
Admission: RE | Admit: 2014-05-22 | Discharge: 2014-05-22 | Disposition: A | Discharge status: Home / Self Care | Payer: Medicare Other | Source: Ambulatory Visit | Attending: Orthopedic Surgery | Admitting: Orthopedic Surgery

## 2014-05-22 ENCOUNTER — Encounter (HOSPITAL_COMMUNITY): Payer: Self-pay

## 2014-05-22 ENCOUNTER — Encounter (INDEPENDENT_AMBULATORY_CARE_PROVIDER_SITE_OTHER): Payer: Self-pay

## 2014-05-22 DIAGNOSIS — Z96649 Presence of unspecified artificial hip joint: Secondary | ICD-10-CM | POA: Insufficient documentation

## 2014-05-22 DIAGNOSIS — Z01818 Encounter for other preprocedural examination: Secondary | ICD-10-CM | POA: Insufficient documentation

## 2014-05-22 DIAGNOSIS — T84099A Other mechanical complication of unspecified internal joint prosthesis, initial encounter: Secondary | ICD-10-CM | POA: Diagnosis present

## 2014-05-22 LAB — BASIC METABOLIC PANEL
ANION GAP: 10 (ref 5–15)
BUN: 17 mg/dL (ref 6–23)
CHLORIDE: 103 meq/L (ref 96–112)
CO2: 28 meq/L (ref 19–32)
Calcium: 9.7 mg/dL (ref 8.4–10.5)
Creatinine, Ser: 0.66 mg/dL (ref 0.50–1.35)
GFR calc Af Amer: >90 mL/min (ref >90)
GFR calc non Af Amer: >90 mL/min (ref >90)
GLUCOSE: 102 mg/dL — AB (ref 70–99)
POTASSIUM: 4.2 meq/L (ref 3.7–5.3)
SODIUM: 141 meq/L (ref 137–147)

## 2014-05-22 LAB — CBC
HEMATOCRIT: 40.3 % (ref 39.0–52.0)
HEMOGLOBIN: 13.8 g/dL (ref 13.0–17.0)
MCH: 31.9 pg (ref 26.0–34.0)
MCHC: 34.2 g/dL (ref 30.0–36.0)
MCV: 93.1 fL (ref 78.0–100.0)
Platelets: 215 10*3/uL (ref 150–400)
RBC: 4.33 MIL/uL (ref 4.22–5.81)
RDW: 12.1 % (ref 11.5–15.5)
WBC: 5.1 10*3/uL (ref 4.0–10.5)

## 2014-05-22 LAB — SURGICAL PCR SCREEN
MRSA, PCR: NEGATIVE
Staphylococcus aureus: NEGATIVE

## 2014-05-22 LAB — APTT: APTT: 31 s (ref 24–37)

## 2014-05-22 LAB — URINALYSIS, ROUTINE W REFLEX MICROSCOPIC
Bilirubin Urine: NEGATIVE
GLUCOSE, UA: NEGATIVE mg/dL
Hgb urine dipstick: NEGATIVE
Ketones, ur: NEGATIVE mg/dL
LEUKOCYTES UA: NEGATIVE
Nitrite: NEGATIVE
PH: 6.5 (ref 5.0–8.0)
Protein, ur: NEGATIVE mg/dL
SPECIFIC GRAVITY, URINE: 1.009 (ref 1.005–1.030)
Urobilinogen, UA: 1 mg/dL (ref 0.0–1.0)

## 2014-05-22 LAB — PROTIME-INR
INR: 1.04 (ref 0.00–1.49)
Prothrombin Time: 13.6 seconds (ref 11.6–15.2)

## 2014-05-22 NOTE — Progress Notes (Signed)
05/22/14 1033  OBSTRUCTIVE SLEEP APNEA  Have you ever been diagnosed with sleep apnea through a sleep study? No  Do you snore loudly (loud enough to be heard through closed doors)?  1  Do you often feel tired, fatigued, or sleepy during the daytime? 1  Has anyone observed you stop breathing during your sleep? 0  Do you have, or are you being treated for high blood pressure? 0  BMI more than 35 kg/m2? 0  Age over 74 years old? 1  Neck circumference greater than 40 cm/16 inches? 0  Gender: 1  Obstructive Sleep Apnea Score 4  Score 4 or greater  Results sent to PCP

## 2014-06-03 ENCOUNTER — Encounter (HOSPITAL_COMMUNITY)
Admission: RE | Disposition: A | Discharge status: Home Health | Payer: Self-pay | Source: Ambulatory Visit | Attending: Orthopedic Surgery

## 2014-06-03 ENCOUNTER — Inpatient Hospital Stay (HOSPITAL_COMMUNITY): Payer: Medicare Other

## 2014-06-03 ENCOUNTER — Inpatient Hospital Stay (HOSPITAL_COMMUNITY): Payer: Medicare Other | Admitting: Anesthesiology

## 2014-06-03 ENCOUNTER — Encounter (HOSPITAL_COMMUNITY): Payer: Self-pay | Admitting: *Deleted

## 2014-06-03 ENCOUNTER — Inpatient Hospital Stay (HOSPITAL_COMMUNITY)
Admission: RE | Admit: 2014-06-03 | Discharge: 2014-06-06 | DRG: 467 | Disposition: A | Discharge status: Home Health | Payer: Medicare Other | Source: Ambulatory Visit | Attending: Orthopedic Surgery | Admitting: Orthopedic Surgery

## 2014-06-03 ENCOUNTER — Encounter (HOSPITAL_COMMUNITY): Payer: Medicare Other | Admitting: Anesthesiology

## 2014-06-03 DIAGNOSIS — E663 Overweight: Secondary | ICD-10-CM | POA: Diagnosis present

## 2014-06-03 DIAGNOSIS — Z6825 Body mass index (BMI) 25.0-25.9, adult: Secondary | ICD-10-CM

## 2014-06-03 DIAGNOSIS — Z01812 Encounter for preprocedural laboratory examination: Secondary | ICD-10-CM

## 2014-06-03 DIAGNOSIS — H409 Unspecified glaucoma: Secondary | ICD-10-CM | POA: Diagnosis present

## 2014-06-03 DIAGNOSIS — E119 Type 2 diabetes mellitus without complications: Secondary | ICD-10-CM | POA: Diagnosis present

## 2014-06-03 DIAGNOSIS — D500 Iron deficiency anemia secondary to blood loss (chronic): Secondary | ICD-10-CM | POA: Diagnosis not present

## 2014-06-03 DIAGNOSIS — M25559 Pain in unspecified hip: Secondary | ICD-10-CM | POA: Diagnosis present

## 2014-06-03 DIAGNOSIS — Y831 Surgical operation with implant of artificial internal device as the cause of abnormal reaction of the patient, or of later complication, without mention of misadventure at the time of the procedure: Secondary | ICD-10-CM | POA: Diagnosis present

## 2014-06-03 DIAGNOSIS — E785 Hyperlipidemia, unspecified: Secondary | ICD-10-CM | POA: Diagnosis present

## 2014-06-03 DIAGNOSIS — T84099A Other mechanical complication of unspecified internal joint prosthesis, initial encounter: Secondary | ICD-10-CM | POA: Diagnosis present

## 2014-06-03 DIAGNOSIS — Z85828 Personal history of other malignant neoplasm of skin: Secondary | ICD-10-CM

## 2014-06-03 DIAGNOSIS — D62 Acute posthemorrhagic anemia: Secondary | ICD-10-CM | POA: Diagnosis not present

## 2014-06-03 DIAGNOSIS — Z96649 Presence of unspecified artificial hip joint: Secondary | ICD-10-CM

## 2014-06-03 HISTORY — PX: ACETABULAR REVISION: SHX5712

## 2014-06-03 HISTORY — DX: Presence of unspecified artificial hip joint: Z96.649

## 2014-06-03 LAB — TYPE AND SCREEN
ABO/RH(D): A POS
ANTIBODY SCREEN: NEGATIVE

## 2014-06-03 LAB — GLUCOSE, CAPILLARY
Glucose-Capillary: 107 mg/dL — ABNORMAL HIGH (ref 70–99)
Glucose-Capillary: 136 mg/dL — ABNORMAL HIGH (ref 70–99)

## 2014-06-03 LAB — ABO/RH: ABO/RH(D): A POS

## 2014-06-03 SURGERY — REVISION, TOTAL ARTHROPLASTY, HIP, ACETABULAR COMPONENT
Anesthesia: General | Site: Hip | Laterality: Left

## 2014-06-03 MED ORDER — ONDANSETRON HCL 4 MG/2ML IJ SOLN
INTRAMUSCULAR | Status: DC | PRN
  Administered 2014-06-03: 4 mg via INTRAVENOUS

## 2014-06-03 MED ORDER — ROCURONIUM BROMIDE 100 MG/10ML IV SOLN
INTRAVENOUS | Status: DC | PRN
  Administered 2014-06-03: 30 mg via INTRAVENOUS

## 2014-06-03 MED ORDER — ONDANSETRON HCL 4 MG/2ML IJ SOLN: 4.0000 mg | Freq: Four times a day (QID) | INTRAMUSCULAR | Status: DC | PRN

## 2014-06-03 MED ORDER — OXYCODONE HCL 5 MG/5ML PO SOLN
5.0000 mg | Freq: Once | ORAL | Status: DC | PRN
  Filled 2014-06-03: qty 5

## 2014-06-03 MED ORDER — BISACODYL 10 MG RE SUPP: 10.0000 mg | Freq: Every day | RECTAL | Status: DC | PRN

## 2014-06-03 MED ORDER — SODIUM CHLORIDE 0.9 % IV SOLN
10.0000 mg | INTRAVENOUS | Status: DC | PRN
  Administered 2014-06-03: 15 ug/min via INTRAVENOUS

## 2014-06-03 MED ORDER — HYDROMORPHONE HCL PF 1 MG/ML IJ SOLN
0.5000 mg | INTRAMUSCULAR | Status: DC | PRN
  Administered 2014-06-03: 1 mg via INTRAVENOUS
  Filled 2014-06-03: qty 1

## 2014-06-03 MED ORDER — PHENYLEPHRINE HCL 10 MG/ML IJ SOLN
INTRAMUSCULAR | Status: DC | PRN
  Administered 2014-06-03 (×4): 80 ug via INTRAVENOUS

## 2014-06-03 MED ORDER — ONDANSETRON HCL 4 MG PO TABS: 4.0000 mg | ORAL_TABLET | Freq: Four times a day (QID) | ORAL | Status: DC | PRN

## 2014-06-03 MED ORDER — METOCLOPRAMIDE HCL 5 MG/ML IJ SOLN
INTRAMUSCULAR | Status: AC
  Filled 2014-06-03: qty 2

## 2014-06-03 MED ORDER — LATANOPROST 0.005 % OP SOLN
1.0000 [drp] | Freq: Every day | OPHTHALMIC | Status: DC
  Administered 2014-06-03 – 2014-06-04 (×2): 1 [drp] via OPHTHALMIC
  Filled 2014-06-03: qty 2.5

## 2014-06-03 MED ORDER — POLYETHYLENE GLYCOL 3350 17 G PO PACK
17.0000 g | PACK | Freq: Two times a day (BID) | ORAL | Status: DC
  Administered 2014-06-04 – 2014-06-06 (×4): 17 g via ORAL

## 2014-06-03 MED ORDER — METOCLOPRAMIDE HCL 5 MG/ML IJ SOLN: 5.0000 mg | Freq: Three times a day (TID) | INTRAMUSCULAR | Status: DC | PRN

## 2014-06-03 MED ORDER — OXYCODONE HCL 5 MG PO TABS: 5.0000 mg | ORAL_TABLET | Freq: Once | ORAL | Status: DC | PRN

## 2014-06-03 MED ORDER — ROCURONIUM BROMIDE 100 MG/10ML IV SOLN
INTRAVENOUS | Status: AC
  Filled 2014-06-03: qty 1

## 2014-06-03 MED ORDER — DEXAMETHASONE SODIUM PHOSPHATE 10 MG/ML IJ SOLN
10.0000 mg | Freq: Once | INTRAMUSCULAR | Status: DC
  Filled 2014-06-03: qty 1

## 2014-06-03 MED ORDER — CEFAZOLIN SODIUM-DEXTROSE 2-3 GM-% IV SOLR
2.0000 g | Freq: Four times a day (QID) | INTRAVENOUS | Status: AC
  Administered 2014-06-03 – 2014-06-04 (×2): 2 g via INTRAVENOUS
  Filled 2014-06-03 (×3): qty 50

## 2014-06-03 MED ORDER — CELECOXIB 200 MG PO CAPS
200.0000 mg | ORAL_CAPSULE | Freq: Two times a day (BID) | ORAL | Status: DC
  Administered 2014-06-03 – 2014-06-06 (×6): 200 mg via ORAL
  Filled 2014-06-03 (×10): qty 1

## 2014-06-03 MED ORDER — METHOCARBAMOL 1000 MG/10ML IJ SOLN
500.0000 mg | Freq: Four times a day (QID) | INTRAVENOUS | Status: DC | PRN
  Administered 2014-06-03: 500 mg via INTRAVENOUS
  Filled 2014-06-03: qty 5

## 2014-06-03 MED ORDER — ALUM & MAG HYDROXIDE-SIMETH 200-200-20 MG/5ML PO SUSP: 30.0000 mL | ORAL | Status: DC | PRN

## 2014-06-03 MED ORDER — GABAPENTIN 300 MG PO CAPS
300.0000 mg | ORAL_CAPSULE | Freq: Three times a day (TID) | ORAL | Status: DC
  Administered 2014-06-03 – 2014-06-06 (×8): 300 mg via ORAL
  Filled 2014-06-03 (×11): qty 1

## 2014-06-03 MED ORDER — HYDROMORPHONE HCL PF 1 MG/ML IJ SOLN
INTRAMUSCULAR | Status: AC
  Filled 2014-06-03: qty 1

## 2014-06-03 MED ORDER — NEOSTIGMINE METHYLSULFATE 10 MG/10ML IV SOLN
INTRAVENOUS | Status: DC | PRN
  Administered 2014-06-03: 3 mg via INTRAVENOUS

## 2014-06-03 MED ORDER — DEXAMETHASONE SODIUM PHOSPHATE 10 MG/ML IJ SOLN
INTRAMUSCULAR | Status: DC | PRN
  Administered 2014-06-03: 10 mg via INTRAVENOUS

## 2014-06-03 MED ORDER — ASPIRIN EC 325 MG PO TBEC
325.0000 mg | DELAYED_RELEASE_TABLET | Freq: Two times a day (BID) | ORAL | Status: DC
  Administered 2014-06-04 – 2014-06-06 (×5): 325 mg via ORAL
  Filled 2014-06-03 (×8): qty 1

## 2014-06-03 MED ORDER — GLYCOPYRROLATE 0.2 MG/ML IJ SOLN
INTRAMUSCULAR | Status: AC
  Filled 2014-06-03: qty 1

## 2014-06-03 MED ORDER — LIDOCAINE HCL (CARDIAC) 20 MG/ML IV SOLN
INTRAVENOUS | Status: AC
  Filled 2014-06-03: qty 5

## 2014-06-03 MED ORDER — MAGNESIUM CITRATE PO SOLN: 1.0000 | Freq: Once | ORAL | Status: AC | PRN

## 2014-06-03 MED ORDER — PROMETHAZINE HCL 25 MG/ML IJ SOLN: 6.2500 mg | INTRAMUSCULAR | Status: DC | PRN

## 2014-06-03 MED ORDER — LIDOCAINE HCL (CARDIAC) 20 MG/ML IV SOLN
INTRAVENOUS | Status: DC | PRN
  Administered 2014-06-03: 100 mg via INTRAVENOUS

## 2014-06-03 MED ORDER — HYDROCODONE-ACETAMINOPHEN 7.5-325 MG PO TABS
1.0000 | ORAL_TABLET | ORAL | Status: DC
  Administered 2014-06-03: 2 via ORAL
  Administered 2014-06-04: 1 via ORAL
  Administered 2014-06-04 (×4): 2 via ORAL
  Administered 2014-06-05 – 2014-06-06 (×5): 1 via ORAL
  Filled 2014-06-03: qty 2
  Filled 2014-06-03 (×3): qty 1
  Filled 2014-06-03 (×2): qty 2
  Filled 2014-06-03: qty 1
  Filled 2014-06-03: qty 2
  Filled 2014-06-03 (×2): qty 1
  Filled 2014-06-03: qty 2

## 2014-06-03 MED ORDER — TRANEXAMIC ACID 100 MG/ML IV SOLN
1000.0000 mg | Freq: Once | INTRAVENOUS | Status: AC
  Administered 2014-06-03: 1000 mg via INTRAVENOUS
  Filled 2014-06-03: qty 10

## 2014-06-03 MED ORDER — METOCLOPRAMIDE HCL 10 MG PO TABS: 5.0000 mg | ORAL_TABLET | Freq: Three times a day (TID) | ORAL | Status: DC | PRN

## 2014-06-03 MED ORDER — FENTANYL CITRATE 0.05 MG/ML IJ SOLN
INTRAMUSCULAR | Status: AC
  Filled 2014-06-03: qty 5

## 2014-06-03 MED ORDER — PROPOFOL 10 MG/ML IV BOLUS
INTRAVENOUS | Status: AC
  Filled 2014-06-03: qty 20

## 2014-06-03 MED ORDER — FERROUS SULFATE 325 (65 FE) MG PO TABS
325.0000 mg | ORAL_TABLET | Freq: Three times a day (TID) | ORAL | Status: DC
  Administered 2014-06-04 – 2014-06-06 (×7): 325 mg via ORAL
  Filled 2014-06-03 (×10): qty 1

## 2014-06-03 MED ORDER — DEXAMETHASONE SODIUM PHOSPHATE 10 MG/ML IJ SOLN
10.0000 mg | Freq: Once | INTRAMUSCULAR | Status: DC
Start: 2014-06-03 — End: 2014-06-03

## 2014-06-03 MED ORDER — ONDANSETRON HCL 4 MG/2ML IJ SOLN
INTRAMUSCULAR | Status: AC
  Filled 2014-06-03: qty 2

## 2014-06-03 MED ORDER — CISATRACURIUM BESYLATE 20 MG/10ML IV SOLN
INTRAVENOUS | Status: AC
  Filled 2014-06-03: qty 10

## 2014-06-03 MED ORDER — GLYCOPYRROLATE 0.2 MG/ML IJ SOLN
INTRAMUSCULAR | Status: AC
  Filled 2014-06-03: qty 2

## 2014-06-03 MED ORDER — METHOCARBAMOL 500 MG PO TABS
500.0000 mg | ORAL_TABLET | Freq: Four times a day (QID) | ORAL | Status: DC | PRN
  Administered 2014-06-04 – 2014-06-05 (×3): 500 mg via ORAL
  Filled 2014-06-03 (×3): qty 1

## 2014-06-03 MED ORDER — PROPOFOL 10 MG/ML IV BOLUS
INTRAVENOUS | Status: DC | PRN
  Administered 2014-06-03: 200 mg via INTRAVENOUS

## 2014-06-03 MED ORDER — DOCUSATE SODIUM 100 MG PO CAPS
100.0000 mg | ORAL_CAPSULE | Freq: Two times a day (BID) | ORAL | Status: DC
  Administered 2014-06-03 – 2014-06-06 (×6): 100 mg via ORAL

## 2014-06-03 MED ORDER — MEPERIDINE HCL 50 MG/ML IJ SOLN: 6.2500 mg | INTRAMUSCULAR | Status: DC | PRN

## 2014-06-03 MED ORDER — LACTATED RINGERS IV SOLN
INTRAVENOUS | Status: DC
  Administered 2014-06-03: 17:00:00 via INTRAVENOUS
  Administered 2014-06-03: 1000 mL via INTRAVENOUS

## 2014-06-03 MED ORDER — PHENOL 1.4 % MT LIQD: 1.0000 | OROMUCOSAL | Status: DC | PRN

## 2014-06-03 MED ORDER — HYDROMORPHONE HCL PF 1 MG/ML IJ SOLN
0.2500 mg | INTRAMUSCULAR | Status: DC | PRN
  Administered 2014-06-03: 0.25 mg via INTRAVENOUS
  Administered 2014-06-03: 0.5 mg via INTRAVENOUS
  Administered 2014-06-03 (×2): 0.25 mg via INTRAVENOUS
  Administered 2014-06-03: 0.5 mg via INTRAVENOUS

## 2014-06-03 MED ORDER — DEXAMETHASONE SODIUM PHOSPHATE 10 MG/ML IJ SOLN
INTRAMUSCULAR | Status: AC
  Filled 2014-06-03: qty 1

## 2014-06-03 MED ORDER — CEFAZOLIN SODIUM-DEXTROSE 2-3 GM-% IV SOLR
INTRAVENOUS | Status: AC
  Filled 2014-06-03: qty 50

## 2014-06-03 MED ORDER — GLYCOPYRROLATE 0.2 MG/ML IJ SOLN
INTRAMUSCULAR | Status: DC | PRN
  Administered 2014-06-03: 0.4 mg via INTRAVENOUS
  Administered 2014-06-03: 0.2 mg via INTRAVENOUS

## 2014-06-03 MED ORDER — ATORVASTATIN CALCIUM 20 MG PO TABS
20.0000 mg | ORAL_TABLET | Freq: Every day | ORAL | Status: DC
Start: 2014-06-04 — End: 2014-06-06
  Administered 2014-06-04 – 2014-06-05 (×2): 20 mg via ORAL
  Filled 2014-06-03 (×3): qty 1

## 2014-06-03 MED ORDER — DIPHENHYDRAMINE HCL 25 MG PO CAPS: 25.0000 mg | ORAL_CAPSULE | Freq: Four times a day (QID) | ORAL | Status: DC | PRN

## 2014-06-03 MED ORDER — 0.9 % SODIUM CHLORIDE (POUR BTL) OPTIME
TOPICAL | Status: DC | PRN
  Administered 2014-06-03: 1000 mL

## 2014-06-03 MED ORDER — MENTHOL 3 MG MT LOZG
1.0000 | LOZENGE | OROMUCOSAL | Status: DC | PRN
  Filled 2014-06-03: qty 9

## 2014-06-03 MED ORDER — SODIUM CHLORIDE 0.9 % IV SOLN
100.0000 mL/h | INTRAVENOUS | Status: DC
  Administered 2014-06-03 – 2014-06-04 (×2): 100 mL/h via INTRAVENOUS
  Filled 2014-06-03 (×9): qty 1000

## 2014-06-03 MED ORDER — LORATADINE 10 MG PO TABS
10.0000 mg | ORAL_TABLET | Freq: Every day | ORAL | Status: DC
  Administered 2014-06-04 – 2014-06-06 (×3): 10 mg via ORAL
  Filled 2014-06-03 (×3): qty 1

## 2014-06-03 MED ORDER — METOCLOPRAMIDE HCL 5 MG/ML IJ SOLN
INTRAMUSCULAR | Status: DC | PRN
  Administered 2014-06-03: 10 mg via INTRAVENOUS

## 2014-06-03 MED ORDER — PHENYLEPHRINE 40 MCG/ML (10ML) SYRINGE FOR IV PUSH (FOR BLOOD PRESSURE SUPPORT)
PREFILLED_SYRINGE | INTRAVENOUS | Status: AC
  Filled 2014-06-03: qty 10

## 2014-06-03 MED ORDER — FENTANYL CITRATE 0.05 MG/ML IJ SOLN
INTRAMUSCULAR | Status: DC | PRN
  Administered 2014-06-03 (×4): 50 ug via INTRAVENOUS

## 2014-06-03 MED ORDER — CEFAZOLIN SODIUM-DEXTROSE 2-3 GM-% IV SOLR
2.0000 g | INTRAVENOUS | Status: AC
  Administered 2014-06-03: 2 g via INTRAVENOUS

## 2014-06-03 MED ORDER — SUCCINYLCHOLINE CHLORIDE 20 MG/ML IJ SOLN
INTRAMUSCULAR | Status: DC | PRN
  Administered 2014-06-03: 100 mg via INTRAVENOUS

## 2014-06-03 SURGICAL SUPPLY — 58 items
BAG SPEC THK2 15X12 ZIP CLS (MISCELLANEOUS) ×1
BAG ZIPLOCK 12X15 (MISCELLANEOUS) ×3 IMPLANT
BALL (Hips) ×3 IMPLANT
BALL ACETAB PINNACLE (Hips) ×1 IMPLANT
BLADE SAW SGTL 18X1.27X75 (BLADE) ×2 IMPLANT
BLADE SAW SGTL 18X1.27X75MM (BLADE) ×1
BONE CHIP PRESERV 30CC PCAN30 (Bone Implant) ×9 IMPLANT
BRUSH FEMORAL CANAL (MISCELLANEOUS) IMPLANT
CUP PINNACLE PRO ACE SZ66 (Cup) ×3 IMPLANT
DRAPE INCISE IOBAN 85X60 (DRAPES) ×3 IMPLANT
DRAPE ORTHO SPLIT 77X108 STRL (DRAPES) ×4
DRAPE POUCH INSTRU U-SHP 10X18 (DRAPES) ×3 IMPLANT
DRAPE SURG 17X11 SM STRL (DRAPES) ×3 IMPLANT
DRAPE SURG ORHT 6 SPLT 77X108 (DRAPES) ×2 IMPLANT
DRAPE U-SHAPE 47X51 STRL (DRAPES) ×3 IMPLANT
DRSG AQUACEL AG ADV 3.5X14 (GAUZE/BANDAGES/DRESSINGS) ×3 IMPLANT
DRSG EMULSION OIL 3X16 NADH (GAUZE/BANDAGES/DRESSINGS) IMPLANT
DRSG MEPILEX BORDER 4X4 (GAUZE/BANDAGES/DRESSINGS) IMPLANT
DRSG MEPILEX BORDER 4X8 (GAUZE/BANDAGES/DRESSINGS) IMPLANT
DURAPREP 26ML APPLICATOR (WOUND CARE) ×3 IMPLANT
ELECT BLADE TIP CTD 4 INCH (ELECTRODE) ×3 IMPLANT
ELECT REM PT RETURN 9FT ADLT (ELECTROSURGICAL) ×3
ELECTRODE REM PT RTRN 9FT ADLT (ELECTROSURGICAL) ×1 IMPLANT
EVACUATOR 1/8 PVC DRAIN (DRAIN) IMPLANT
FACESHIELD WRAPAROUND (MASK) ×12 IMPLANT
GLOVE BIOGEL PI IND STRL 7.5 (GLOVE) ×1 IMPLANT
GLOVE BIOGEL PI IND STRL 8.5 (GLOVE) ×1 IMPLANT
GLOVE BIOGEL PI INDICATOR 7.5 (GLOVE) ×2
GLOVE BIOGEL PI INDICATOR 8.5 (GLOVE) ×2
GLOVE ORTHO TXT STRL SZ7.5 (GLOVE) ×6 IMPLANT
GLOVE SURG ORTHO 8.0 STRL STRW (GLOVE) ×3 IMPLANT
GOWN SPEC L3 XXLG W/TWL (GOWN DISPOSABLE) ×6 IMPLANT
GOWN STRL REUS W/TWL LRG LVL3 (GOWN DISPOSABLE) ×3 IMPLANT
HANDPIECE INTERPULSE COAX TIP (DISPOSABLE)
KIT BASIN OR (CUSTOM PROCEDURE TRAY) ×3 IMPLANT
LINER NEUTRAL 60X36X58 P4 HIP (Liner) ×3 IMPLANT
MANIFOLD NEPTUNE II (INSTRUMENTS) ×3 IMPLANT
NS IRRIG 1000ML POUR BTL (IV SOLUTION) ×3 IMPLANT
PACK TOTAL JOINT (CUSTOM PROCEDURE TRAY) ×3 IMPLANT
POSITIONER SURGICAL ARM (MISCELLANEOUS) ×6 IMPLANT
PRESSURIZER FEMORAL UNIV (MISCELLANEOUS) ×3 IMPLANT
SCREW 6.5MMX35MM (Screw) ×6 IMPLANT
SCREW PERIPHERAL BONE SZ 5MX45 (Screw) ×3 IMPLANT
SCREW PINN CAN 6.5X20 (Screw) ×6 IMPLANT
SCREW TPR HD 5.0MM DIA 50 (Screw) ×3 IMPLANT
SET HNDPC FAN SPRY TIP SCT (DISPOSABLE) IMPLANT
SPONGE LAP 18X18 X RAY DECT (DISPOSABLE) IMPLANT
SPONGE LAP 4X18 X RAY DECT (DISPOSABLE) IMPLANT
STAPLER VISISTAT 35W (STAPLE) IMPLANT
SUCTION FRAZIER TIP 10 FR DISP (SUCTIONS) ×3 IMPLANT
SUT MNCRL AB 3-0 PS2 18 (SUTURE) ×3 IMPLANT
SUT VIC AB 1 CT1 36 (SUTURE) ×9 IMPLANT
SUT VIC AB 2-0 CT1 27 (SUTURE) ×9
SUT VIC AB 2-0 CT1 TAPERPNT 27 (SUTURE) ×3 IMPLANT
TOWEL OR 17X26 10 PK STRL BLUE (TOWEL DISPOSABLE) ×6 IMPLANT
TOWER CARTRIDGE SMART MIX (DISPOSABLE) IMPLANT
TRAY FOLEY CATH 14FRSI W/METER (CATHETERS) ×3 IMPLANT
WATER STERILE IRR 1500ML POUR (IV SOLUTION) ×3 IMPLANT

## 2014-06-03 NOTE — Anesthesia Preprocedure Evaluation (Addendum)
Anesthesia Evaluation  Patient identified by MRN, date of birth, ID band Patient awake    Reviewed: Allergy & Precautions, H&P , NPO status , Patient's Chart, lab work & pertinent test results  Airway Mallampati: II TM Distance: >3 FB Neck ROM: Full    Dental no notable dental hx. (+) Partial Lower, Partial Upper, Dental Advisory Given   Pulmonary neg pulmonary ROS,  breath sounds clear to auscultation  Pulmonary exam normal       Cardiovascular negative cardio ROS  Rhythm:Regular Rate:Normal     Neuro/Psych negative neurological ROS  negative psych ROS   GI/Hepatic negative GI ROS, Neg liver ROS,   Endo/Other  diabetes, Well Controlled  Renal/GU negative Renal ROS     Musculoskeletal  (+) Arthritis -,   Abdominal   Peds  Hematology negative hematology ROS (+)   Anesthesia Other Findings   Reproductive/Obstetrics negative OB ROS                          Anesthesia Physical  Anesthesia Plan  ASA: II  Anesthesia Plan: General   Post-op Pain Management:    Induction: Intravenous  Airway Management Planned: Oral ETT  Additional Equipment:   Intra-op Plan:   Post-operative Plan: Extubation in OR  Informed Consent: I have reviewed the patients History and Physical, chart, labs and discussed the procedure including the risks, benefits and alternatives for the proposed anesthesia with the patient or authorized representative who has indicated his/her understanding and acceptance.   Dental advisory given  Plan Discussed with: CRNA  Anesthesia Plan Comments:      Anesthesia Quick Evaluation

## 2014-06-03 NOTE — Transfer of Care (Signed)
Immediate Anesthesia Transfer of Care Note  Patient: William [name]  Procedure(s) Performed: Procedure(s): LEFT ACETABULAR REVISION (Left)  Patient Location: PACU  Anesthesia Type:General  Level of Consciousness: sedated  Airway & Oxygen Therapy: Patient Spontanous Breathing and Patient connected to face mask oxygen  Post-op Assessment: Report given to PACU RN and Post -op Vital signs reviewed and stable  Post vital signs: Reviewed and stable  Complications: No apparent anesthesia complications

## 2014-06-03 NOTE — Brief Op Note (Signed)
06/03/2014  5:56 PM  PATIENT:  William [name]  73 y.o. male  PRE-OPERATIVE DIAGNOSIS:  FAILED LEFT TOTAL HIP ARTHROPLASTY  POST-OPERATIVE DIAGNOSIS:  FAILED LEFT TOTAL HIP ARTHROPLASTY  PROCEDURE:  Procedure(s): LEFT total hip arthroplasty REVISION with allograft  90cc of cancellous bone chips  SURGEON:  Surgeon(s) and Role:    * Mauri Pole, MD - Primary  PHYSICIAN ASSISTANT: Danae Orleans, PA-C  ANESTHESIA:   spinal  EBL:  Total I/O In: 1000 [I.V.:1000] Out: 1200 [Urine:400; Blood:800]  BLOOD ADMINISTERED:none  DRAINS: none   LOCAL MEDICATIONS USED:  NONE  SPECIMEN:  No Specimen  DISPOSITION OF SPECIMEN:  N/A  COUNTS:  YES  TOURNIQUET:  * No tourniquets in log *  DICTATION: .Other Dictation: Dictation Number [id number]  PLAN OF CARE: Admit to inpatient   PATIENT DISPOSITION:  PACU - hemodynamically stable.   Delay start of Pharmacological VTE agent (>24hrs) due to surgical blood loss or risk of bleeding: no

## 2014-06-03 NOTE — Anesthesia Postprocedure Evaluation (Signed)
  Anesthesia Post-op Note  Patient: William [name]  Procedure(s) Performed: Procedure(s) (LRB): LEFT ACETABULAR REVISION (Left)  Patient Location: PACU  Anesthesia Type: General  Level of Consciousness: awake and alert   Airway and Oxygen Therapy: Patient Spontanous Breathing  Post-op Pain: mild  Post-op Assessment: Post-op Vital signs reviewed, Patient's Cardiovascular Status Stable, Respiratory Function Stable, Patent Airway and No signs of Nausea or vomiting  Last Vitals:  Filed Vitals:   06/03/14 1312  BP: 148/81  Pulse: 85  Temp: 36.4 C  Resp: 18    Post-op Vital Signs: stable   Complications: No apparent anesthesia complications

## 2014-06-03 NOTE — Interval H&P Note (Signed)
History and Physical Interval Note:  06/03/2014 2:44 PM  William [name]  has presented today for surgery, with the diagnosis of FAILED LEFT TOTAL HIP ARTHROPLASTY  The various methods of treatment have been discussed with the patient and family. After consideration of risks, benefits and other options for treatment, the patient has consented to  Procedure(s): LEFT ACETABULAR REVISION (Left) as a surgical intervention .  The patient's history has been reviewed, patient examined, no change in status, stable for surgery.  I have reviewed the patient's chart and labs.  Questions were answered to the patient's satisfaction.     Mauri Pole

## 2014-06-04 ENCOUNTER — Encounter (HOSPITAL_COMMUNITY): Payer: Self-pay | Admitting: Orthopedic Surgery

## 2014-06-04 LAB — BASIC METABOLIC PANEL
ANION GAP: 11 (ref 5–15)
BUN: 18 mg/dL (ref 6–23)
CHLORIDE: 101 meq/L (ref 96–112)
CO2: 25 mEq/L (ref 19–32)
Calcium: 8.6 mg/dL (ref 8.4–10.5)
Creatinine, Ser: 0.77 mg/dL (ref 0.50–1.35)
GFR calc non Af Amer: 87 mL/min — ABNORMAL LOW (ref >90)
Glucose, Bld: 167 mg/dL — ABNORMAL HIGH (ref 70–99)
POTASSIUM: 4.8 meq/L (ref 3.7–5.3)
Sodium: 137 mEq/L (ref 137–147)

## 2014-06-04 LAB — CBC
HEMATOCRIT: 30.7 % — AB (ref 39.0–52.0)
Hemoglobin: 10.5 g/dL — ABNORMAL LOW (ref 13.0–17.0)
MCH: 31.7 pg (ref 26.0–34.0)
MCHC: 34.2 g/dL (ref 30.0–36.0)
MCV: 92.7 fL (ref 78.0–100.0)
PLATELETS: 199 10*3/uL (ref 150–400)
RBC: 3.31 MIL/uL — ABNORMAL LOW (ref 4.22–5.81)
RDW: 12.2 % (ref 11.5–15.5)
WBC: 7.8 10*3/uL (ref 4.0–10.5)

## 2014-06-04 NOTE — Progress Notes (Addendum)
William [name] is providing the following services: [employer] (patient declined rw - has one at home already.)   If patient discharges after hours, please call [phone].   Linward Headland 06/04/2014, 10:18 AM

## 2014-06-04 NOTE — Evaluation (Signed)
Physical Therapy Evaluation Patient Details Name: William [name] MRN: [id number] DOB: Mar 16, 1941 Today's Date: 06/04/2014   History of Present Illness  Pt is a 73 year old male s/p L acetabular revision and reports multiple surgeries on bilateral hips.  Clinical Impression  Patient is s/p L acetabular revision surgery resulting in functional limitations due to the deficits listed below (see PT Problem List).  Patient will benefit from skilled PT to increase their independence and safety with mobility to allow discharge to the venue listed below.  Pt mobilizing well POD #1.  Pt educated on precautions and requires cues during mobility.  Pt plans to d/c home.     Follow Up Recommendations Home health PT    Equipment Recommendations  3in1 (PT)    Recommendations for Other Services       Precautions / Restrictions Precautions Precautions: Fall;Posterior Hip Restrictions Weight Bearing Restrictions: Yes LLE Weight Bearing: Partial weight bearing LLE Partial Weight Bearing Percentage or Pounds: 50%      Mobility  Bed Mobility Overal bed mobility: Needs Assistance Bed Mobility: Supine to Sit     Supine to sit: Min assist;HOB elevated     General bed mobility comments: verbal cues for hip precautions, assist for L LE  Transfers Overall transfer level: Needs assistance Equipment used: Rolling walker (2 wheeled) Transfers: Sit to/from Stand Sit to Stand: Min assist         General transfer comment: verbal cues for UE and LE positioning  Ambulation/Gait Ambulation/Gait assistance: Min guard Ambulation Distance (Feet): 100 Feet Assistive device: Rolling walker (2 wheeled) Gait Pattern/deviations: Step-to pattern;Antalgic;Decreased stance time - left Gait velocity: decr   General Gait Details: verbal cues for sequence, step length, RW distance, turning toward unaffected side  Stairs            Wheelchair Mobility    Modified Rankin (Stroke Patients Only)        Balance                                             Pertinent Vitals/Pain Pain Assessment: 0-10 Pain Score: 1  Pain Location: L hip Pain Descriptors / Indicators: Sore Pain Intervention(s): Limited activity within patient's tolerance;Monitored during session;Premedicated before session;Repositioned    Home Living Family/patient expects to be discharged to:: Private residence Living Arrangements: Spouse/significant other   Type of Home: House Home Access: Stairs to enter Entrance Stairs-Rails: None Entrance Stairs-Number of Steps: 1 Home Layout: One level Home Equipment: Environmental consultant - 2 wheels;Cane - single point;Shower seat;Adaptive equipment      Prior Function Level of Independence: Independent               Hand Dominance        Extremity/Trunk Assessment   Upper Extremity Assessment: Overall WFL for tasks assessed           Lower Extremity Assessment: LLE deficits/detail   LLE Deficits / Details: pt reports weakness prior to surgery, decreased functional hip strength observed     Communication   Communication: No difficulties  Cognition Arousal/Alertness: Awake/alert Behavior During Therapy: WFL for tasks assessed/performed Overall Cognitive Status: Within Functional Limits for tasks assessed                      General Comments      Exercises Total Joint Exercises Ankle Circles/Pumps: AROM;Both;15 reps Quad Sets:  AROM;Both;15 reps Gluteal Sets: AROM;Both;15 reps Short Arc Quad: AROM;Left;15 reps Hip ABduction/ADduction: AROM;Left;10 reps      Assessment/Plan    PT Assessment Patient needs continued PT services  PT Diagnosis Difficulty walking;Acute pain   PT Problem List Decreased strength;Decreased activity tolerance;Decreased mobility;Decreased knowledge of precautions;Pain;Decreased knowledge of use of DME  PT Treatment Interventions Functional mobility training;Stair training;Gait training;DME  instruction;Patient/family education;Therapeutic activities;Therapeutic exercise   PT Goals (Current goals can be found in the Care Plan section) Acute Rehab PT Goals Patient Stated Goal: be more independent. PT Goal Formulation: With patient Time For Goal Achievement: 06/08/14 Potential to Achieve Goals: Good    Frequency 7X/week   Barriers to discharge        Co-evaluation               End of Session Equipment Utilized During Treatment: Gait belt Activity Tolerance: Patient tolerated treatment well Patient left: in chair;with family/visitor present;with call bell/phone within reach           Time: 0857-0927 PT Time Calculation (min): 30 min   Charges:   PT Evaluation $Initial PT Evaluation Tier I: 1 Procedure PT Treatments $Gait Training: 8-22 mins $Therapeutic Exercise: 8-22 mins   PT G Codes:          Victoriya Pol,KATHrine E 06/04/2014, 1:07 PM Carmelia Bake, PT, DPT 06/04/2014 Pager: [phone]

## 2014-06-04 NOTE — Evaluation (Signed)
Occupational Therapy Evaluation Patient Details Name: William [name] MRN: [id number] DOB: 07-Aug-1941 Today's Date: 06/04/2014    History of Present Illness Pt is a 73 year old male s/p L acetabular revision and reports multiple surgeries on bilateral hips.   Clinical Impression   Pt up to practice 3in1 transfer with OT using walker. Also practiced and educated on AE options and his THPs. Will benefit from continued OT to reinforce THPs and increase ADL independence.     Follow Up Recommendations  Home health OT;Supervision/Assistance - 24 hour    Equipment Recommendations  3 in 1 bedside comode;Tub/shower bench (tubbench if covered by insurance. )    Recommendations for Other Services       Precautions / Restrictions Precautions Precautions: Fall;Posterior Hip Restrictions Weight Bearing Restrictions: Yes LLE Weight Bearing: Partial weight bearing LLE Partial Weight Bearing Percentage or Pounds: 50%      Mobility Bed Mobility               General bed mobility comments: in chair  Transfers Overall transfer level: Needs assistance Equipment used: Rolling walker (2 wheeled) Transfers: Sit to/from Stand Sit to Stand: Min assist         General transfer comment: verbal cues for L LE management and hand placement.     Balance                                            ADL Overall ADL's : Needs assistance/impaired Eating/Feeding: Independent;Sitting   Grooming: Set up;Sitting   Upper Body Bathing: Set up;Sitting   Lower Body Bathing: Moderate assistance;Sit to/from stand   Upper Body Dressing : Set up;Sitting   Lower Body Dressing: Moderate assistance;Sit to/from stand   Toilet Transfer: Minimal assistance;Ambulation;BSC;RW   Toileting- Clothing Manipulation and Hygiene: Minimal assistance;Sit to/from stand         General ADL Comments: Educated on AE options. Pt has a reacher and LHS but is interested in sock aid and shoe horn.  He practiced with doffing L sock with reacher with min assist and atttempted to don with sock aid but pt needs wide sock aid to be able to slide foot through sock aid. Pt states he will likely purchase AE kit and explaiend that OT can exchange out for a wide sock aid if department has one in stock. Practiced 3in1 transfers and pt needs some cues for THPs especially to not turn L foot in ward. Wife present. Discussed tubbench option and pt is interested if covered by insurance. He states his shower seat was covered so informed case manager of inquiry by pt.      Vision                     Perception     Praxis      Pertinent Vitals/Pain Pain Assessment: 0-10 Pain Score: 1  Pain Location: L hip Pain Descriptors / Indicators: Sore Pain Intervention(s): Limited activity within patient's tolerance;Monitored during session;Premedicated before session;Repositioned     Hand Dominance     Extremity/Trunk Assessment Upper Extremity Assessment Upper Extremity Assessment: Overall WFL for tasks assessed           Communication Communication Communication: No difficulties   Cognition Arousal/Alertness: Awake/alert Behavior During Therapy: WFL for tasks assessed/performed Overall Cognitive Status: Within Functional Limits for tasks assessed  General Comments       Exercises       Shoulder Instructions      Home Living Family/patient expects to be discharged to:: Private residence Living Arrangements: Spouse/significant other   Type of Home: House Home Access: Stairs to enter [employer] of Steps: 1 Entrance Stairs-Rails: None Home Layout: One level     Bathroom Shower/Tub: Teacher, early years/pre: Standard     Home Equipment: Environmental consultant - 2 wheels;Cane - single point;Shower seat;Adaptive equipment Adaptive Equipment: Reacher;Long-handled sponge        Prior Functioning/Environment Level of Independence: Independent              OT Diagnosis: Generalized weakness   OT Problem List: Decreased strength;Decreased knowledge of use of DME or AE;Decreased knowledge of precautions   OT Treatment/Interventions: Self-care/ADL training;Patient/family education;Therapeutic activities;DME and/or AE instruction    OT Goals(Current goals can be found in the care plan section) Acute Rehab OT Goals Patient Stated Goal: be more independent. OT Goal Formulation: With patient/family Time For Goal Achievement: 06/11/14 Potential to Achieve Goals: Good  OT Frequency: Min 2X/week   Barriers to D/C:            Co-evaluation              End of Session Equipment Utilized During Treatment: Gait belt;Rolling walker  Activity Tolerance: Patient tolerated treatment well Patient left: in chair;with call bell/phone within reach;with family/visitor present   Time: 0952-1029 OT Time Calculation (min): 37 min Charges:  OT General Charges $OT Visit: 1 Procedure OT Evaluation $Initial OT Evaluation Tier I: 1 Procedure OT Treatments $Self Care/Home Management : 8-22 mins $Therapeutic Activity: 8-22 mins G-Codes:    Jules Schick [phone] 06/04/2014, 11:26 AM

## 2014-06-04 NOTE — Progress Notes (Signed)
CSW consulted for SNF placement. CSW met with pt / spouse and reviewed PN . Pt plans to return home following hospital d/c. RNCM is assisting with d/c planning.  Werner Lean LCSW [phone]

## 2014-06-04 NOTE — Care Management Note (Signed)
    Page 1 of 2   06/06/2014     1:50:15 PM CARE MANAGEMENT NOTE 06/06/2014  Patient:  William William [name], William William [name]   Account Number:  [id number]  Date Initiated:  06/04/2014  Documentation initiated by:  [facility]  Subjective/Objective Assessment:   adm: LEFT ACETABULAR REVISION (Left)     Action/Plan:   discharge planning   Anticipated DC Date:  06/05/2014   Anticipated DC Plan:  [place]  CM consult      [facility] Choice  HOME HEALTH   Choice offered to / List presented to:  C-1 Patient   DME arranged  3-N-1  TUB BENCH      DME agency  [place] arranged  [place]   Status of service:  Completed, signed off Medicare Important Message given?  YES (If response is "NO", the following Medicare IM given date fields will be blank) Date Medicare IM given:  06/04/2014 Medicare IM given by:  [facility] Date Additional Medicare IM given:   Additional Medicare IM given by:    Discharge Disposition:  [place]  Per UR Regulation:    If discussed at Long Length of Stay Meetings, dates discussed:    Comments:  06/06/14 13:45 CM notes HHOT has been added.  CM called add on to [place].  No other CM needs were communicated. Mariane Masters, BSN, Jearld Lesch [phone]. 06/04/14 14:30 CM met with pt and spouse, William William [name] to offer choice.  Pt expressed he wishes to have William William [name] who worked for [employer] but now works for [employer] but if [name] could not Glass blower/designer, then he would go with William [name]. CM called [facility] rep, Miranda who states they cannot guarantee and pt has decided to go with William William [name] aware).  CM gave referral to [place] who is on unit.  Pt asks whether William [name] tub transfer bench would be covered by his insurance.  CM called [facility] DME rep, Pura Spice who will check. Pura Spice has brought the 3n1 to the room.  Will wait to hear about transfer bench.  Mariane Masters, BSN, Cm [phone].

## 2014-06-04 NOTE — Op Note (Signed)
NAMEALLAH, REASON NO.:  [id number]  MEDICAL RECORD NO.:  [id number]  LOCATION:  7510                         FACILITY:  [facility]  PHYSICIAN:  Pietro Cassis. Alvan Dame, M.D.  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  POSTOPERATIVE DIAGNOSIS/FINDINGS:  Loose acetabulum with significant native acetabular expansion as a result of movement of the acetabular shell and polyethylene debris.  In addition, I found significant shedding of the porous coating on the acetabular shells resulted in the loosening mechanism of failure.  PROCEDURE:  Revision of left total hip arthroplasty with 90cc of allograft cancellous bone chips.  The components used were a DePuy Deep Profile Gription revision cup with multiple screws into the dome and periphery.  A 36+ 4, 10-degree face changing liner, and a 36+ 0 large taper, 14/16 femoral head.  SURGEON:  Pietro Cassis. Alvan Dame, M.D.  ASSISTANT:  Danae Orleans, PAC.  Note that Mr. Guinevere Scarlet was present for the entirety of the case from preoperative positioning, perioperative management of operative extremity, general facilitation of the case, and primary wound closure.  ANESTHESIA:  Spinal.  SPECIMENS:  None.  COMPLICATIONS:  None apparent.  DRAINS:  None.  BLOOD LOSS:  800 mL.  IV FLUIDS:  Per Anesthesia.  INDICATION FOR THE PROCEDURE:  Mr. Seaman is a pleasant 73 year old male seen and evaluated for second opinion and evaluation of painful left total hip arthroplasty.  He had index hip arthroplasty in the early 90s with subsequent revision in 2004.  He had been doing well up until couple years ago.  He began noting onset of pain has progressively worsened since this spring of 2015.  He sought evaluation upon referral from his primary surgeon, Dr. Gildardo Cranker.  At the  time of evaluation, there was concern of acetabular loosening and penetration into the pelvis with expansion of the native acetabulum.  Bone scan to confirm loosening.  After reviewing these findings, there was no concern for infection.  Based on preoperative work up, he was scheduled for a revision left total hip arthroplasty.  Risks, benefits, of infection, DVT, dislocation, need for future surgeries, failure of this surgery were all discussed and reviewed.  Consent was obtained for benefit of pain relief.  PROCEDURE IN DETAIL:  The patient was brought to operative theater. Once adequate anesthesia, preoperative antibiotics, Ancef administered, he was positioned into the right lateral decubitus position with left side up.  The left lower extremity was then prepped and draped in a sterile fashion.  The patient's old incision was identified and marked on the skin.  After following the time-out, the old incision was excised.  Soft tissue planes created at the iliotibial band and gluteal fascia.  This was then split removing old Ethibond sutures in this area. Following exposure posteriorly, the pseudocapsule was taken down off the posterior femur getting down to the joint surface.  No signs of purulence.  Upon exposure of the posterior half for 2/3 of the acetabular shell, it was readily evident how loose the acetabular shell  was.  One of the old pins from the S-ROM coupler was removed.  At this point, after removing the cup around some more head room it came out of the acetabulum without difficulty or complication.  At this point, I spent a significant portion of the case debriding the acetabulum, identifying the native anatomy inferiorly along the posterior ischium and posterior column and anterior along the pubis and anterior column. The superior rim of the acetabular ilium was intact, however, there was some expansion into the body of the ilium.  I used a large curette to debride this  area.  There was penetration medially without bony support medially.  Following the debridement of all the soft tissue around this area which included soft tissue, that contained some of the porous coated from the old acetabular shell, I then began reaming, had removed a 54 cup from this hip.  I then began reaming just with the 53 just to get my bearings and reamed up sequentially by 2 mm up to 64-mm reamer. At this point, I felt I had contact along the superior ilium of the acetabulum and then great contact in redevelopment of an acetabulum along the inferior aspect and the ischium posterior column and anteriorly.  Based on the osteolytic cavity identified and by the actual debridement necessary around the native acetabulum, I ended up opening up 90 mL of cancellous chips, each of the three 30 mL containers were then emptied into the acetabulum and reverse reamed with a 55 reamer.  I had selected the 56 Deep Profile Gription revision cup based on trials.  After I placed and impacted the 90 mL of cancellous chips and reverse reamed, it gave [place] the appearance of the native acetabulum.  My primary points of contact with this new cup were going to be on the periphery, not supported by this bone graft.  Bone graft was placed in hopes of potentially reconstituted in iliac bone growth in these areas.  The final 81mm DP Gription revision cup was then impacted.  It actually had very good initial scratch fit on the periphery of the acetabulum.  It appeared to be positioned in an anatomic position probably with about 20 degrees of forward flexion and about 35-40 degrees of abduction.  I ended up placing 3 dome screws into periphery screws.  Based on the bone quality, there was not a great purchase.  The peripheral screws had better purchase in the dome.  However, I was very satisfied with the peripheral contact initially.  I placed a trial liner and did a trial reduction with 36 liner and a 36  head.  This seemed to restore leg lengths back to normal based on the acetabulum anatomy and the position of the new cup.  Based on the trial reduction, position, and orientation of the well-fixed femoral stem, I selected a 36, +4, 10- degree face changing liner and impacted it with the lip of the 10-degree portion at around 3 o'clock for this left hip.  I then selected based on the trial reduction just with 36+ 0 large taper, 14-16 taper.  This was then impacted on a clean and dry trunnion, that had no evidence of trunnionosis or metallosis concern.  The hip was reduced.  We had irrigated the hip throughout the case.  There is a very little soft tissue posteriorly from either reapproximated, I thus, just reapproximated the iliotibial band and gluteal fascia using a combination of #1 Vicryl interrupted and running sutures.  The remaining wound  was closed with 2-0 Vicryl and then a running 3-0 Monocryl.  The hip was then cleaned, dried, and dressed sterilely using Dermabond and Aquacel dressing.  He was then brought to the recovery room in stable condition, tolerating the procedure well.  Findings were reviewed with family.  Plan will be for him to be partial weightbearing for 4-6 weeks __________ for stability of the cup before I allow him to progress. Findings were reviewed.     Pietro Cassis Alvan Dame, M.D.     MDO/MEDQ  D:  06/03/2014  T:  06/04/2014  Job:  [id number]

## 2014-06-04 NOTE — Progress Notes (Signed)
Physical Therapy Treatment Note    06/04/14 1400  PT Visit Information  Last PT Received On 06/04/14  Assistance Needed +1  History of Present Illness Pt is a 73 year old male s/p L acetabular revision and reports multiple surgeries on bilateral hips.  PT Time Calculation  PT Start Time 1351  PT Stop Time 1411  PT Time Calculation (min) 20 min  Subjective Data  Subjective Pt assisted with ambulating in hallway.  Pt unable to recall all precautions so reviewed verbally and with activity.  Precautions  Precautions Fall;Posterior Hip  Restrictions  Weight Bearing Restrictions Yes  LLE Weight Bearing PWB  LLE Partial Weight Bearing Percentage or Pounds 50%  Pain Assessment  Pain Assessment 0-10  Pain Score 2  Pain Location L hip  Pain Descriptors / Indicators Sore  Pain Intervention(s) Monitored during session;Limited activity within patient's tolerance;Repositioned  Cognition  Arousal/Alertness Awake/alert  Behavior During Therapy WFL for tasks assessed/performed  Overall Cognitive Status Within Functional Limits for tasks assessed  Bed Mobility  Overal bed mobility Needs Assistance  Bed Mobility Sit to Supine  Sit to supine Min guard  General bed mobility comments verbal cues for self assist using sheet for L LE  Transfers  Overall transfer level Needs assistance  Equipment used Rolling walker (2 wheeled)  Transfers Sit to/from Stand  Sit to Stand Min assist  General transfer comment verbal cues for UE and LE positioning  Ambulation/Gait  Ambulation/Gait assistance Min guard  Ambulation Distance (Feet) 180 Feet  Assistive device Rolling walker (2 wheeled)  Gait Pattern/deviations Step-to pattern;Antalgic;Decreased stance time - left  Gait velocity decr  General Gait Details verbal cues for sequence, step length, RW distance, pt able to recall turning toward unaffected side  PT - End of Session  Activity Tolerance Patient tolerated treatment well  Patient left in  bed;with call bell/phone within reach;with family/visitor present  PT - Assessment/Plan  PT Plan Current plan remains appropriate  PT Frequency 7X/week  Follow Up Recommendations Home health PT  PT equipment 3in1 (PT)  PT Goal Progression  Progress towards PT goals Progressing toward goals  PT General Charges  $$ ACUTE PT VISIT 1 Procedure  PT Treatments  $Gait Training 8-22 mins   Carmelia Bake, PT, DPT 06/04/2014 Pager: [phone]

## 2014-06-04 NOTE — Progress Notes (Signed)
Patient ID: William [name], male   DOB: 09-Jun-1941, 73 y.o.   MRN: [id number] Subjective: 1 Day Post-Op Procedure(s) (LRB): LEFT ACETABULAR REVISION (Left)    Patient reports pain as mild.  Did well last night, no events  Objective:   VITALS:   Filed Vitals:   06/04/14 0800  BP:   Pulse:   Temp:   Resp: 15    Neurovascular intact Incision: dressing C/D/I  LABS  Recent Labs  06/04/14 0439  HGB 10.5*  HCT 30.7*  WBC 7.8  PLT 199     Recent Labs  06/04/14 0439  NA 137  K 4.8  BUN 18  CREATININE 0.77  GLUCOSE 167*    No results found for this basename: LABPT, INR,  in the last 72 hours   Assessment/Plan: 1 Day Post-Op Procedure(s) (LRB): LEFT ACETABULAR REVISION (Left)   Advance diet Up with therapy Plan for discharge tomorrow if does well with therapy, PWB left lower extremity

## 2014-06-04 NOTE — Progress Notes (Signed)
Utilization review completed.  

## 2014-06-05 DIAGNOSIS — E663 Overweight: Secondary | ICD-10-CM

## 2014-06-05 DIAGNOSIS — D500 Iron deficiency anemia secondary to blood loss (chronic): Secondary | ICD-10-CM | POA: Diagnosis not present

## 2014-06-05 HISTORY — DX: Iron deficiency anemia secondary to blood loss (chronic): D50.0

## 2014-06-05 HISTORY — DX: Overweight: E66.3

## 2014-06-05 LAB — CBC
HEMATOCRIT: 26.7 % — AB (ref 39.0–52.0)
Hemoglobin: 9.1 g/dL — ABNORMAL LOW (ref 13.0–17.0)
MCH: 32.9 pg (ref 26.0–34.0)
MCHC: 34.1 g/dL (ref 30.0–36.0)
MCV: 96.4 fL (ref 78.0–100.0)
Platelets: 148 10*3/uL — ABNORMAL LOW (ref 150–400)
RBC: 2.77 MIL/uL — ABNORMAL LOW (ref 4.22–5.81)
RDW: 12.6 % (ref 11.5–15.5)
WBC: 5.6 10*3/uL (ref 4.0–10.5)

## 2014-06-05 LAB — BASIC METABOLIC PANEL
Anion gap: 8 (ref 5–15)
BUN: 18 mg/dL (ref 6–23)
CO2: 27 mEq/L (ref 19–32)
Calcium: 8.3 mg/dL — ABNORMAL LOW (ref 8.4–10.5)
Chloride: 106 mEq/L (ref 96–112)
Creatinine, Ser: 0.83 mg/dL (ref 0.50–1.35)
GFR calc Af Amer: >90 mL/min (ref >90)
GFR, EST NON AFRICAN AMERICAN: 85 mL/min — AB (ref >90)
GLUCOSE: 137 mg/dL — AB (ref 70–99)
Potassium: 4.3 mEq/L (ref 3.7–5.3)
Sodium: 141 mEq/L (ref 137–147)

## 2014-06-05 MED ORDER — FERROUS SULFATE 325 (65 FE) MG PO TABS: 325.0000 mg | ORAL_TABLET | Freq: Three times a day (TID) | ORAL | Status: DC

## 2014-06-05 MED ORDER — ASPIRIN 325 MG PO TBEC: 325.0000 mg | DELAYED_RELEASE_TABLET | Freq: Two times a day (BID) | ORAL | Status: AC

## 2014-06-05 MED ORDER — POLYETHYLENE GLYCOL 3350 17 G PO PACK: 17.0000 g | PACK | Freq: Two times a day (BID) | ORAL | Status: DC

## 2014-06-05 MED ORDER — HYDROCODONE-ACETAMINOPHEN 7.5-325 MG PO TABS: 1.0000 | ORAL_TABLET | ORAL | Status: DC | PRN

## 2014-06-05 MED ORDER — DSS 100 MG PO CAPS: 100.0000 mg | ORAL_CAPSULE | Freq: Two times a day (BID) | ORAL | Status: DC

## 2014-06-05 NOTE — Progress Notes (Signed)
Occupational Therapy Treatment Patient Details Name: William [name] MRN: [id number] DOB: April 03, 1941 Today's Date: 06/05/2014    History of present illness Pt is a 73 year old male s/p L acetabular revision and reports multiple surgeries on bilateral hips.   OT comments  Pt needed increased time to focus as he was worried about leaving this day. Pt now spending tonight and able to focus on therapy   Follow Up Recommendations  No OT follow up;Supervision/Assistance - 24 hour    Equipment Recommendations    tub bench      Precautions / Restrictions Precautions Precautions: Fall;Posterior Hip Restrictions LLE Weight Bearing: Partial weight bearing LLE Partial Weight Bearing Percentage or Pounds: 50       Mobility Bed Mobility Overal bed mobility: Needs Assistance Bed Mobility: Supine to Sit     Supine to sit: Min assist     General bed mobility comments: verbal cues for self assist using sheet for L LE  Transfers Overall transfer level: Needs assistance Equipment used: Rolling walker (2 wheeled) Transfers: Sit to/from Stand Sit to Stand: Min guard         General transfer comment: verbal cues for UE and LE positioning        ADL   Eating/Feeding: Independent;Sitting           Lower Body Bathing: Moderate assistance;Sit to/from stand   Upper Body Dressing : Set up;Sitting   Lower Body Dressing: Sit to/from stand;Moderate assistance Lower Body Dressing Details (indicate cue type and reason): AE - wide sock aid Toilet Transfer: Supervision/safety;Ambulation;Comfort height toilet;BSC   Toileting- Clothing Manipulation and Hygiene: Sit to/from stand;Supervision/safety   Tub/ Banker:  (OT called son regarding tub bench reccomendation.) Clinical cytogeneticist Details (indicate cue type and reason): son will let pt know if he has access to one Functional mobility during ADLs: Minimal assistance General ADL Comments: Pt anxious regarding possible DC  home.  Pt calling many family members regarding DC.  Nurse made decision during OT session pt would spend tonight and pt able to focus during OT session stating this would help him be safer he he and his wife. OT agreed  . Pt needed incerased time to perform all tasks                Cognition   Behavior During Therapy: [name] for tasks assessed/performed Overall Cognitive Status: Within Functional Limits for tasks assessed                         Exercises        General Comments  pt feels better about leaving tomorrow rather than today    Pertinent Vitals/ Pain       Pain Score: 3  Pain Location: L hip Pain Descriptors / Indicators: Sore     Prior Functioning/Environment              Frequency Min 2X/week     Progress Toward Goals  OT Goals(current goals can now be found in the care plan section)  Progress towards OT goals: Progressing toward goals     Plan Discharge plan remains appropriate       End of Session     Activity Tolerance Patient tolerated treatment well   Patient Left in chair;with call bell/phone within reach   Nurse Communication          Time: 2951-8841 OT Time Calculation (min): 54 min  Charges: OT General Charges $OT Visit: 1  Procedure OT Treatments $Self Care/Home Management : 53-67 mins  William [name], Thereasa Parkin 06/05/2014, 9:01 AM

## 2014-06-05 NOTE — Progress Notes (Signed)
     Subjective: 2 Days Post-Op Procedure(s) (LRB): LEFT ACETABULAR REVISION (Left)   Patient reports pain as mild, pain controlled. No events throughout the night. Ready to be discharged home if he does well with PT and pain stays controlled.   Objective:   VITALS:   Filed Vitals:   06/05/14 0550  BP: 132/54  Pulse: 65  Temp: 98.3 F (36.8 C)  Resp: 20    Dorsiflexion/Plantar flexion intact Incision: dressing C/D/I No cellulitis present Compartment soft  LABS  Recent Labs  06/04/14 0439 06/05/14 0447  HGB 10.5* 9.1*  HCT 30.7* 26.7*  WBC 7.8 5.6  PLT 199 148*     Recent Labs  06/04/14 0439 06/05/14 0447  NA 137 141  K 4.8 4.3  BUN 18 18  CREATININE 0.77 0.83  GLUCOSE 167* 137*     Assessment/Plan: 2 Days Post-Op Procedure(s) (LRB): LEFT ACETABULAR REVISION (Left) Up with therapy Discharge home with home health if he does well with PT and pain stays controlled.  Follow up in 2 weeks at [facility]. Follow up with OLIN,Racer Quam D in 2 weeks.  Contact information:  [facility] [address], [place] [phone]    Expected ABLA  Treated with iron and will observe  Overweight (BMI 25-29.9) Estimated body mass index is 26.02 kg/(m^2) as calculated from the following:   Height as of this encounter: 5' 10.5" (1.791 m).   Weight as of this encounter: 83.462 kg (184 lb). Patient also counseled that weight may inhibit the healing process Patient counseled that losing weight will help with future health issues        West Pugh. Breylen Agyeman   PAC  06/05/2014, 8:00 AM

## 2014-06-05 NOTE — Progress Notes (Signed)
Physical Therapy Treatment Note   06/05/14 1400  PT Visit Information  Last PT Received On 06/05/14  Assistance Needed +1  History of Present Illness Pt is a 73 year old male s/p L acetabular revision and reports multiple surgeries on bilateral hips.  PT Time Calculation  PT Start Time 1402  PT Stop Time 1420  PT Time Calculation (min) 18 min  Subjective Data  Subjective Pt ambulated in hallway again and mobilizing better this afternoon.  Precautions  Precautions Fall;Posterior Hip  Restrictions  Weight Bearing Restrictions Yes  LLE Weight Bearing PWB  LLE Partial Weight Bearing Percentage or Pounds 50%  Pain Assessment  Pain Assessment 0-10  Pain Score 2  Pain Location L thigh  Pain Descriptors / Indicators Aching;Sore  Pain Intervention(s) Monitored during session;Repositioned;Premedicated before session  Cognition  Arousal/Alertness Awake/alert  Behavior During Therapy WFL for tasks assessed/performed  Overall Cognitive Status Within Functional Limits for tasks assessed  Bed Mobility  Overal bed mobility Needs Assistance  Bed Mobility Sit to Supine  Sit to supine Supervision  General bed mobility comments verbal cues for self assist with sheet  Transfers  Overall transfer level Needs assistance  Equipment used Rolling walker (2 wheeled)  Transfers Sit to/from Stand  Sit to Stand Min guard  General transfer comment good hand placement, again stood prior to RW in front of pt however held onto armrests to steady  Ambulation/Gait  Ambulation/Gait assistance Min guard;Supervision  Ambulation Distance (Feet) 160 Feet  Assistive device Rolling walker (2 wheeled)  Gait Pattern/deviations Step-to pattern;Antalgic  Gait velocity decr  General Gait Details verbal cues for step length and posture  PT - End of Session  Activity Tolerance Patient tolerated treatment well  Patient left in bed;with call bell/phone within reach;with family/visitor present  PT - Assessment/Plan   PT Plan Current plan remains appropriate  PT Frequency 7X/week  Follow Up Recommendations Home health PT  PT equipment 3in1 (PT)  PT Goal Progression  Progress towards PT goals Progressing toward goals  PT General Charges  $$ ACUTE PT VISIT 1 Procedure  PT Treatments  $Gait Training 8-22 mins   Carmelia Bake, PT, DPT 06/05/2014 Pager: [phone]

## 2014-06-05 NOTE — Progress Notes (Signed)
Physical Therapy Treatment Patient Details Name: William [name] MRN: [id number] DOB: 22-Oct-1940 Today's Date: 06/05/2014    History of Present Illness Pt is a 73 year old male s/p L acetabular revision and reports multiple surgeries on bilateral hips.    PT Comments    Pt ambulated in hallway and practiced one step.  Pt also performed exercises in recliner.  Pt reports d/c home tomorrow.  Follow Up Recommendations  Home health PT     Equipment Recommendations  3in1 (PT)    Recommendations for Other Services       Precautions / Restrictions Precautions Precautions: Fall;Posterior Hip Restrictions Weight Bearing Restrictions: Yes LLE Weight Bearing: Partial weight bearing LLE Partial Weight Bearing Percentage or Pounds: 50%    Mobility  Bed Mobility Overal bed mobility: Needs Assistance Bed Mobility: Supine to Sit     Supine to sit: Min assist     General bed mobility comments: verbal cues for self assist using sheet for L LE  Transfers Overall transfer level: Needs assistance Equipment used: Rolling walker (2 wheeled) Transfers: Sit to/from Stand Sit to Stand: Min guard         General transfer comment: verbal cues precautions, to wait for RW in front of pt prior to standing  Ambulation/Gait     Assistive device: Rolling walker (2 wheeled) Gait Pattern/deviations: Step-to pattern;Decreased stance time - left Gait velocity: decr   General Gait Details: verbal cues for step length and posture   Stairs Stairs: Yes Stairs assistance: Min guard Stair Management: Step to pattern;Backwards;With walker Number of Stairs: 1 General stair comments: pt attempted forwards however difficulty stepping up and keeping PWB so given min assist and discussed performing backwards for safety and maintaining precautions (as above)  Wheelchair Mobility    Modified Rankin (Stroke Patients Only)       Balance                                     Cognition Arousal/Alertness: Awake/alert Behavior During Therapy: WFL for tasks assessed/performed Overall Cognitive Status: Within Functional Limits for tasks assessed                      Exercises      General Comments        Pertinent Vitals/Pain Pain Assessment: 0-10 Pain Score: 3  Pain Location: L thigh Pain Descriptors / Indicators: Aching;Sore Pain Intervention(s): Limited activity within patient's tolerance;Monitored during session;RN gave pain meds during session    Home Living                      Prior Function            PT Goals (current goals can now be found in the care plan section) Progress towards PT goals: Progressing toward goals    Frequency  7X/week    PT Plan Current plan remains appropriate    Co-evaluation             End of Session Equipment Utilized During Treatment: Gait belt Activity Tolerance: Patient tolerated treatment well Patient left: with call bell/phone within reach;in chair     Time: 0943-1010 PT Time Calculation (min): 27 min  Charges:  $Gait Training: 8-22 mins $Therapeutic Exercise: 8-22 mins                    G Codes:  Mattelyn Imhoff,KATHrine E 06/05/2014, 10:58 AM Carmelia Bake, PT, DPT 06/05/2014 Pager: [phone]

## 2014-06-06 NOTE — Progress Notes (Signed)
Pt discharged home. AVS and "my chart" discussed with patient. Pt verbalizes medications, signs and symptoms of infection, follow-up appointments, and dressing changes. No current report or evidence of distress. Pt educated to return to ER in cases of SOB, chest pain, or dizziness.

## 2014-06-06 NOTE — Progress Notes (Signed)
Physical Therapy Treatment Patient Details Name: William [name] MRN: [id number] DOB: October 23, 1940 Today's Date: Jun 18, 2014    History of Present Illness Pt is a 73 year old male s/p L acetabular revision and reports multiple surgeries on bilateral hips.    PT Comments    Pt ambulated in hallway, practiced one step, and performed LE exercises.  Extensively reviewed posterior hip precautions and PWB and pt provided with handout.  Pt also given handout for HEP.  Pt had no further questions/concerns and feels prepared for d/c home today.  Follow Up Recommendations  Home health PT     Equipment Recommendations  3in1 (PT)    Recommendations for Other Services       Precautions / Restrictions Precautions Precautions: Fall;Posterior Hip Restrictions Weight Bearing Restrictions: Yes LLE Weight Bearing: Partial weight bearing LLE Partial Weight Bearing Percentage or Pounds: 50%    Mobility  Bed Mobility               General bed mobility comments: pt up in recliner on arrival  Transfers Overall transfer level: Needs assistance Equipment used: Rolling walker (2 wheeled) Transfers: Sit to/from Stand Sit to Stand: Supervision            Ambulation/Gait Ambulation/Gait assistance: Supervision Ambulation Distance (Feet): 200 Feet Assistive device: Rolling walker (2 wheeled) Gait Pattern/deviations: Step-to pattern;Antalgic Gait velocity: decr   General Gait Details: verbal cues for step length and posture   Stairs Stairs: Yes Stairs assistance: Min guard Stair Management: Step to pattern;Backwards;With walker Number of Stairs: 1 General stair comments: verbal cues for safety, technique, sequence  Wheelchair Mobility    Modified Rankin (Stroke Patients Only)       Balance                                    Cognition Arousal/Alertness: Awake/alert Behavior During Therapy: WFL for tasks assessed/performed Overall Cognitive Status: Within  Functional Limits for tasks assessed                      Exercises Total Joint Exercises Ankle Circles/Pumps: AROM;Both;15 reps Quad Sets: AROM;Both;15 reps Gluteal Sets: AROM;Both;15 reps Short Arc Quad: AROM;Left;15 reps Heel Slides: AAROM;Left;10 reps (within precautions) Hip ABduction/ADduction: AROM;Left;10 reps    General Comments        Pertinent Vitals/Pain Pain Assessment: 0-10 Pain Score: 2  Pain Location: L hip Pain Descriptors / Indicators: Sore Pain Intervention(s): Monitored during session;Repositioned    Home Living                      Prior Function            PT Goals (current goals can now be found in the care plan section) Progress towards PT goals: Progressing toward goals    Frequency  7X/week    PT Plan Current plan remains appropriate    Co-evaluation             End of Session   Activity Tolerance: Patient tolerated treatment well Patient left: with call bell/phone within reach;with family/visitor present;in chair     Time: 1005-1030 PT Time Calculation (min): 25 min  Charges:  $Gait Training: 8-22 mins $Therapeutic Exercise: 8-22 mins                    G Codes:      China Deitrick,KATHrine E 06-18-14, 1:56 PM Jannette Spanner  Earmon Phoenix, PT, DPT 06/06/2014 Pager: [phone]

## 2014-06-06 NOTE — Progress Notes (Signed)
     Subjective: 3 Days Post-Op Procedure(s) (LRB): LEFT ACETABULAR REVISION (Left)   Patient reports pain as mild, pain controlled. No events throughout the night.  States that his wife will be coming this afternoon to be able to bring him home.  Ready to be discharged home.  Objective:   VITALS:   Filed Vitals:   06/06/14 0525  BP: 148/80  Pulse: 60  Temp: 97.7 F (36.5 C)  Resp: 20    Dorsiflexion/Plantar flexion intact Incision: dressing C/D/I No cellulitis present Compartment soft  LABS  Recent Labs  06/04/14 0439 06/05/14 0447  HGB 10.5* 9.1*  HCT 30.7* 26.7*  WBC 7.8 5.6  PLT 199 148*     Recent Labs  06/04/14 0439 06/05/14 0447  NA 137 141  K 4.8 4.3  BUN 18 18  CREATININE 0.77 0.83  GLUCOSE 167* 137*     Assessment/Plan: 3 Days Post-Op Procedure(s) (LRB): LEFT ACETABULAR REVISION (Left) Up with therapy Discharge home with home health Follow up in 2 weeks at [facility]. Follow up with OLIN,Micco Bourbeau D in 2 weeks.  Contact information:  [facility] [address] Kylie Simmonds   PAC  06/06/2014, 8:14 AM

## 2014-06-06 NOTE — Progress Notes (Signed)
Occupational Therapy Treatment Patient Details Name: William [name] MRN: [id number] DOB: 09/20/1941 Today's Date: 06/06/2014    History of present illness Pt is a 73 year old male s/p L acetabular revision and reports multiple surgeries on bilateral hips.   OT comments  Pt doing well but still needs cues for reinforcement of hip precautions. Recommend HHOT to help progress ADL independence and reinforcement of precautions further.   Follow Up Recommendations  Home health OT;Supervision/Assistance - 24 hour    Equipment Recommendations  3 in 1 bedside comode (son able to obtain a tubbench)    Recommendations for Other Services      Precautions / Restrictions Precautions Precautions: Fall;Posterior Hip Restrictions Weight Bearing Restrictions: Yes LLE Weight Bearing: Partial weight bearing LLE Partial Weight Bearing Percentage or Pounds: 50%       Mobility Bed Mobility Overal bed mobility: Needs Assistance Bed Mobility: Supine to Sit     Supine to sit: Min assist     General bed mobility comments: min assist for L LE off the bed.  Transfers Overall transfer level: Needs assistance Equipment used: Rolling walker (2 wheeled) Transfers: Sit to/from Stand Sit to Stand: Min guard         General transfer comment: min verbal cues for hand placement.    Balance                                   ADL                       Lower Body Dressing: Moderate assistance;Sit to/from stand;With adaptive equipment Lower Body Dressing Details (indicate cue type and reason): pt was min assist with reacher to don underwear and pants. min assist to doff sock with reacher, min assist to don sock with sock aid and needed assist with shoe laces due to precautions. Educated on elastic laces and where to obtain.  Toilet Transfer: Min guard;Stand-pivot;RW             General ADL Comments: Demonstrated tubbench transfers and how to perform with THPs. The bench  that his son brought by faces the opposite direction of how his tub will be at home with faucet. Discussed option of either removing screws and facing the back of the seat the other direction if the bench allows versus just using hand held shower to come around from behind and face the end of the tub. Pt states he will have to do this because his toilet is at the end of the tub in the way of bench. Pt verbalizes understanding of how to use tubbench after demo. Practiced with AE to dress and pt needs min/mod cues to not turn L foot inward with dressing. Wife can help with laces and also educated on elastic laces. Educated on standing to perform hygiene at toilet and safer for pt to wash periareas standing at the sink than to lean side to side on bench since he tends to let L LE turn inward. Recommend HHOT to reinforce precautions.       Vision                     Perception     Praxis      Cognition   Behavior During Therapy: [facility] for tasks assessed/performed Overall Cognitive Status: Within Functional Limits for tasks assessed  Extremity/Trunk Assessment               Exercises     Shoulder Instructions       General Comments      Pertinent Vitals/ Pain       Pain Assessment: 0-10 Pain Score: 3  Pain Location: L hip Pain Descriptors / Indicators: Sore Pain Intervention(s): Repositioned;Ice applied  Home Living                                          Prior Functioning/Environment              Frequency Min 2X/week     Progress Toward Goals  OT Goals(current goals can now be found in the care plan section)  Progress towards OT goals: Progressing toward goals     Plan Discharge plan needs to be updated    Co-evaluation                 End of Session Equipment Utilized During Treatment: Rolling walker   Activity Tolerance Patient tolerated treatment well   Patient Left in chair;with call  bell/phone within reach   Nurse Communication          Time: 4496-7591 OT Time Calculation (min): 46 min  Charges: OT General Charges $OT Visit: 1 Procedure OT Treatments $Self Care/Home Management : 23-37 mins $Therapeutic Activity: 8-22 mins  Jules Schick [phone] 06/06/2014, 9:39 AM

## 2014-06-06 NOTE — Plan of Care (Signed)
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

## 2014-06-10 NOTE — Discharge Summary (Signed)
Physician Discharge Summary  Patient ID: William [name] MRN: [id number] DOB/AGE: 74-Sep-1942 74 y.o.  Admit date: 06/03/2014 Discharge date: 06/06/2014   Procedures:  Procedure(s) (LRB): LEFT ACETABULAR REVISION (Left)  Attending Physician:  Dr. Paralee Cancel   Admission Diagnoses:   Left hip pain S/P left THA  Discharge Diagnoses:  Principal Problem:   S/P left TH revision Active Problems:   Overweight (BMI 25.0-29.9)   Expected blood loss anemia  Past Medical History  Diagnosis Date  . Hyperlipemia   . Arthritis   . Glaucoma (increased eye pressure)     bilaterally  . Hearing difficulty     "has ringing in left ear"  . Neuromuscular disorder     numbness/tingling lower extremeties  . Diabetes mellitus     borderline controlled with diet, checks hemaglobin A 1 C every 3-4 months  . Cancer 2014    skin ca, basal cell    HPI: William [name], 73 y.o. 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 The indications for the revision total hip arthroplasty are loosening of one or more components and failure of acetabular component. Onset of symptoms was gradual starting years ago with gradually worsening course since that time. Prior procedures on the left hip include arthroplasty. Patient currently rates pain in the left hip at 10 out of 10 with activity. There is night pain, worsening of pain with activity and weight bearing, trendelenberg gait, pain that interfers with activities of daily living and pain with passive range of motion. Patient has evidence of failure of acetabular component, with protrusion in to the pelvis by imaging studies. This condition presents safety issues increasing the risk of falls. There is  no current active infection. Risks, benefits and expectations were discussed with the patient. Risks including but not limited to the risk of anesthesia, blood clots, nerve damage, blood vessel damage, failure of the prosthesis, infection and up to and including death. Patient understand the risks, benefits and expectations and wishes to proceed with surgery.   PCP: Glenda Chroman., MD   Discharged Condition: good  Hospital Course:  Patient underwent the above stated procedure on 06/03/2014. Patient tolerated the procedure well and brought to the recovery room in good condition and subsequently to the floor.  POD #1  Patient reports pain as mild. Did well last night, no events. Dorsiflexion/plantar flexion intact, incision: dressing C/D/I, no cellulitis present and compartment soft.   LABS  Basename    HGB  10.5  HCT  30.7   POD #2  BP: 132/54 ; Pulse: 65 ; Temp: 98.3 F (36.8 C) ; Resp: 20  Patient reports pain as mild, pain controlled. No events throughout the night. Dorsiflexion/plantar flexion intact, incision: dressing C/D/I, no cellulitis present and compartment soft.   LABS  Basename    HGB  9.1  HCT  26.7   POD #3  BP: 148/80 ; Pulse: 60 ; Temp: 97.7 F (36.5 C) ; Resp: 20  Patient reports pain as mild, pain controlled. No events throughout the night. States that his wife will be coming this afternoon to be able to bring him home. Ready to be discharged home. Dorsiflexion/plantar flexion intact, incision: dressing C/D/I, no cellulitis present and compartment soft.   LABS   No new labs  Discharge Exam: General appearance: alert, cooperative and no distress Extremities: Homans sign is negative, no sign of DVT, no edema, redness or tenderness in the calves or thighs and no ulcers, gangrene or trophic changes  Disposition: Home with follow up in 2 weeks   Follow-up Information   Follow up with [facility] (home health physical therapy)    Contact information:     [address] [place] [address] [phone]       Follow up with Mauri Pole, MD. Schedule an appointment as soon as possible for a visit in 2 weeks.   Specialty:  Orthopedic Surgery   Contact information:   [address] [place] [address] [phone]       Discharge Instructions   Call MD / Call 911    Complete by:  As directed   If you experience chest pain or shortness of breath, CALL 911 and be transported to the hospital emergency room.  If you develope a fever above 101 F, pus (white drainage) or increased drainage or redness at the wound, or calf pain, call your surgeon's office.     Change dressing    Complete by:  As directed   Maintain surgical dressing for 10-14 days, or until follow up in the clinic.     Constipation Prevention    Complete by:  As directed   Drink plenty of fluids.  Prune juice may be helpful.  You may use a stool softener, such as Colace (over the counter) 100 mg twice a day.  Use MiraLax (over the counter) for constipation as needed.     Diet - low sodium heart healthy    Complete by:  As directed      Discharge instructions    Complete by:  As directed   Maintain surgical dressing for 10-14 days, or until follow up in the clinic. Follow up in 2 weeks at [facility]. Call with any questions or concerns.     Partial weight bearing    Complete by:  As directed   % Body Weight:  50  Laterality:  left  Extremity:  Lower     TED hose    Complete by:  As directed   Use stockings (TED hose) for 2 weeks on both leg(s).  You may remove them at night for sleeping.             Medication List    STOP taking these medications       diclofenac 75 MG EC tablet  Commonly known as:  VOLTAREN     naproxen sodium 220 MG tablet  Commonly known as:  ANAPROX     traMADol 50 MG tablet  Commonly known as:  ULTRAM      TAKE these medications       aspirin 325 MG EC tablet  Take 1 tablet (325 mg  total) by mouth 2 (two) times daily.     atorvastatin 20 MG tablet  Commonly known as:  LIPITOR  Take 20 mg by mouth every morning.     cetirizine 10 MG tablet  Commonly known as:  ZYRTEC  Take 10 mg by mouth daily as needed. For allergies     DSS 100 MG Caps  Take 100 mg by mouth 2 (two) times daily.     ferrous sulfate 325 (65 FE) MG tablet  Take 1 tablet (325 mg total) by mouth 3 (three) times daily after meals.  Fish Oil 1200 MG Caps  Take 3,600 capsules by mouth daily.     gabapentin 300 MG capsule  Commonly known as:  NEURONTIN  Take 300 mg by mouth 3 (three) times daily.     HYDROcodone-acetaminophen 7.5-325 MG per tablet  Commonly known as:  NORCO  Take 1-2 tablets by mouth every 4 (four) hours as needed for moderate pain.     latanoprost 0.005 % ophthalmic solution  Commonly known as:  XALATAN  Place 1 drop into both eyes at bedtime.     Lysine 500 MG Caps  Take 1 capsule by mouth daily.     multivitamin with minerals Tabs tablet  Take 1 tablet by mouth daily.     polyethylene glycol packet  Commonly known as:  MIRALAX / GLYCOLAX  Take 17 g by mouth 2 (two) times daily.         Signed: West Pugh. Que Meneely   PA-C  06/10/2014, 1:14 PM

## 2015-06-20 ENCOUNTER — Other Ambulatory Visit: Payer: Self-pay | Admitting: Neurosurgery

## 2015-06-20 DIAGNOSIS — M5137 Other intervertebral disc degeneration, lumbosacral region: Secondary | ICD-10-CM

## 2015-06-27 ENCOUNTER — Ambulatory Visit
Admission: RE | Admit: 2015-06-27 | Discharge: 2015-06-27 | Disposition: A | Discharge status: Home / Self Care | Payer: Medicare Other | Source: Ambulatory Visit | Attending: Neurosurgery | Admitting: Neurosurgery

## 2015-06-27 DIAGNOSIS — M5137 Other intervertebral disc degeneration, lumbosacral region: Secondary | ICD-10-CM

## 2015-06-27 MED ORDER — MEPERIDINE HCL 100 MG/ML IJ SOLN
75.0000 mg | Freq: Once | INTRAMUSCULAR | Status: AC
  Administered 2015-06-27: 75 mg via INTRAMUSCULAR

## 2015-06-27 MED ORDER — ONDANSETRON HCL 4 MG/2ML IJ SOLN
4.0000 mg | Freq: Once | INTRAMUSCULAR | Status: AC
Start: 2015-06-27 — End: 2015-06-27
  Administered 2015-06-27: 4 mg via INTRAMUSCULAR

## 2015-06-27 MED ORDER — DIAZEPAM 5 MG PO TABS
5.0000 mg | ORAL_TABLET | Freq: Once | ORAL | Status: AC
  Administered 2015-06-27: 5 mg via ORAL

## 2015-06-27 MED ORDER — IOHEXOL 180 MG/ML  SOLN
17.0000 mL | Freq: Once | INTRAMUSCULAR | Status: DC | PRN
  Administered 2015-06-27: 17 mL via INTRATHECAL

## 2015-06-27 NOTE — Progress Notes (Signed)
Patient states he has been off Tramadol for at least the past two days. 

## 2015-06-27 NOTE — Discharge Instructions (Addendum)
Myelogram Discharge Instructions  1. Go home and rest quietly for the next 24 hours.  It is important to lie flat for the next 24 hours.  Get up only to go to the restroom.  You may lie in the bed or on a couch on your back, your stomach, your left side or your right side.  You may have one pillow under your head.  You may have pillows between your knees while you are on your side or under your knees while you are on your back.  2. DO NOT drive today.  Recline the seat as far back as it will go, while still wearing your seat belt, on the way home.  3. You may get up to go to the bathroom as needed.  You may sit up for 10 minutes to eat.  You may resume your normal diet and medications unless otherwise indicated.  Drink lots of extra fluids today and tomorrow.  4. The incidence of headache, nausea, or vomiting is about 5% (one in 20 patients).  If you develop a headache, lie flat and drink plenty of fluids until the headache goes away.  Caffeinated beverages may be helpful.  If you develop severe nausea and vomiting or a headache that does not go away with flat bed rest, call [phone].  5. You may resume normal activities after your 24 hours of bed rest is over; however, do not exert yourself strongly or do any heavy lifting tomorrow. If when you get up you have a headache when standing, go back to bed and force fluids for another 24 hours.  6. Call your physician for a follow-up appointment.  The results of your myelogram will be sent directly to your physician by the following day.  7. If you have any questions or if complications develop after you arrive home, please call [phone].  Discharge instructions have been explained to the patient.  The patient, or the person responsible for the patient, fully understands these instructions.       May resume Tramadol on Oct. 8, 2016, after 8:30 am.

## 2015-07-17 ENCOUNTER — Other Ambulatory Visit: Payer: Self-pay | Admitting: Neurosurgery

## 2015-07-17 DIAGNOSIS — M48061 Spinal stenosis, lumbar region without neurogenic claudication: Secondary | ICD-10-CM

## 2015-07-18 ENCOUNTER — Ambulatory Visit
Admission: RE | Admit: 2015-07-18 | Discharge: 2015-07-18 | Disposition: A | Discharge status: Home / Self Care | Payer: Medicare Other | Source: Ambulatory Visit | Attending: Neurosurgery | Admitting: Neurosurgery

## 2015-07-18 ENCOUNTER — Other Ambulatory Visit: Payer: Self-pay | Admitting: Neurosurgery

## 2015-07-18 DIAGNOSIS — M48061 Spinal stenosis, lumbar region without neurogenic claudication: Secondary | ICD-10-CM

## 2015-07-18 MED ORDER — METHYLPREDNISOLONE ACETATE 40 MG/ML INJ SUSP (RADIOLOG
120.0000 mg | Freq: Once | INTRAMUSCULAR | Status: AC
  Administered 2015-07-18: 120 mg via EPIDURAL

## 2015-07-18 MED ORDER — IOHEXOL 180 MG/ML  SOLN
1.0000 mL | Freq: Once | INTRAMUSCULAR | Status: DC | PRN
  Administered 2015-07-18: 1 mL via EPIDURAL

## 2015-07-18 NOTE — Discharge Instructions (Signed)

## 2015-10-21 DIAGNOSIS — L821 Other seborrheic keratosis: Secondary | ICD-10-CM | POA: Diagnosis not present

## 2015-10-21 DIAGNOSIS — E1142 Type 2 diabetes mellitus with diabetic polyneuropathy: Secondary | ICD-10-CM | POA: Diagnosis not present

## 2015-10-21 DIAGNOSIS — J449 Chronic obstructive pulmonary disease, unspecified: Secondary | ICD-10-CM | POA: Diagnosis not present

## 2015-10-21 DIAGNOSIS — L570 Actinic keratosis: Secondary | ICD-10-CM | POA: Diagnosis not present

## 2015-10-21 DIAGNOSIS — L578 Other skin changes due to chronic exposure to nonionizing radiation: Secondary | ICD-10-CM | POA: Diagnosis not present

## 2015-11-11 DIAGNOSIS — H40023 Open angle with borderline findings, high risk, bilateral: Secondary | ICD-10-CM | POA: Diagnosis not present

## 2015-11-11 DIAGNOSIS — L570 Actinic keratosis: Secondary | ICD-10-CM | POA: Diagnosis not present

## 2015-12-04 DIAGNOSIS — E7800 Pure hypercholesterolemia, unspecified: Secondary | ICD-10-CM | POA: Diagnosis not present

## 2015-12-04 DIAGNOSIS — E119 Type 2 diabetes mellitus without complications: Secondary | ICD-10-CM | POA: Diagnosis not present

## 2015-12-15 DIAGNOSIS — M4806 Spinal stenosis, lumbar region: Secondary | ICD-10-CM | POA: Diagnosis not present

## 2015-12-15 DIAGNOSIS — I10 Essential (primary) hypertension: Secondary | ICD-10-CM | POA: Diagnosis not present

## 2015-12-15 DIAGNOSIS — Z6830 Body mass index (BMI) 30.0-30.9, adult: Secondary | ICD-10-CM | POA: Diagnosis not present

## 2016-01-21 DIAGNOSIS — E119 Type 2 diabetes mellitus without complications: Secondary | ICD-10-CM | POA: Diagnosis not present

## 2016-01-21 DIAGNOSIS — E7800 Pure hypercholesterolemia, unspecified: Secondary | ICD-10-CM | POA: Diagnosis not present

## 2016-01-22 DIAGNOSIS — Z789 Other specified health status: Secondary | ICD-10-CM | POA: Diagnosis not present

## 2016-01-22 DIAGNOSIS — I10 Essential (primary) hypertension: Secondary | ICD-10-CM | POA: Diagnosis not present

## 2016-01-22 DIAGNOSIS — J449 Chronic obstructive pulmonary disease, unspecified: Secondary | ICD-10-CM | POA: Diagnosis not present

## 2016-01-22 DIAGNOSIS — E1142 Type 2 diabetes mellitus with diabetic polyneuropathy: Secondary | ICD-10-CM | POA: Diagnosis not present

## 2016-01-22 DIAGNOSIS — M1990 Unspecified osteoarthritis, unspecified site: Secondary | ICD-10-CM | POA: Diagnosis not present

## 2016-02-06 DIAGNOSIS — Z7189 Other specified counseling: Secondary | ICD-10-CM | POA: Diagnosis not present

## 2016-02-06 DIAGNOSIS — Z79899 Other long term (current) drug therapy: Secondary | ICD-10-CM | POA: Diagnosis not present

## 2016-02-06 DIAGNOSIS — Z299 Encounter for prophylactic measures, unspecified: Secondary | ICD-10-CM | POA: Diagnosis not present

## 2016-02-06 DIAGNOSIS — Z125 Encounter for screening for malignant neoplasm of prostate: Secondary | ICD-10-CM | POA: Diagnosis not present

## 2016-02-06 DIAGNOSIS — Z1211 Encounter for screening for malignant neoplasm of colon: Secondary | ICD-10-CM | POA: Diagnosis not present

## 2016-02-06 DIAGNOSIS — Z6827 Body mass index (BMI) 27.0-27.9, adult: Secondary | ICD-10-CM | POA: Diagnosis not present

## 2016-02-06 DIAGNOSIS — Z0000 Encounter for general adult medical examination without abnormal findings: Secondary | ICD-10-CM | POA: Diagnosis not present

## 2016-02-06 DIAGNOSIS — R5383 Other fatigue: Secondary | ICD-10-CM | POA: Diagnosis not present

## 2016-02-06 DIAGNOSIS — E7800 Pure hypercholesterolemia, unspecified: Secondary | ICD-10-CM | POA: Diagnosis not present

## 2016-02-06 DIAGNOSIS — Z1389 Encounter for screening for other disorder: Secondary | ICD-10-CM | POA: Diagnosis not present

## 2016-02-23 DIAGNOSIS — E7800 Pure hypercholesterolemia, unspecified: Secondary | ICD-10-CM | POA: Diagnosis not present

## 2016-02-23 DIAGNOSIS — E119 Type 2 diabetes mellitus without complications: Secondary | ICD-10-CM | POA: Diagnosis not present

## 2016-04-29 DIAGNOSIS — E1142 Type 2 diabetes mellitus with diabetic polyneuropathy: Secondary | ICD-10-CM | POA: Diagnosis not present

## 2016-04-29 DIAGNOSIS — M545 Low back pain: Secondary | ICD-10-CM | POA: Diagnosis not present

## 2016-04-29 DIAGNOSIS — Z299 Encounter for prophylactic measures, unspecified: Secondary | ICD-10-CM | POA: Diagnosis not present

## 2016-06-10 DIAGNOSIS — M5136 Other intervertebral disc degeneration, lumbar region: Secondary | ICD-10-CM | POA: Diagnosis not present

## 2016-06-10 DIAGNOSIS — Z96649 Presence of unspecified artificial hip joint: Secondary | ICD-10-CM | POA: Diagnosis not present

## 2016-06-10 DIAGNOSIS — Z471 Aftercare following joint replacement surgery: Secondary | ICD-10-CM | POA: Diagnosis not present

## 2016-06-15 DIAGNOSIS — R262 Difficulty in walking, not elsewhere classified: Secondary | ICD-10-CM | POA: Diagnosis not present

## 2016-06-15 DIAGNOSIS — M6281 Muscle weakness (generalized): Secondary | ICD-10-CM | POA: Diagnosis not present

## 2016-06-15 DIAGNOSIS — M549 Dorsalgia, unspecified: Secondary | ICD-10-CM | POA: Diagnosis not present

## 2016-06-15 DIAGNOSIS — M25652 Stiffness of left hip, not elsewhere classified: Secondary | ICD-10-CM | POA: Diagnosis not present

## 2016-06-23 DIAGNOSIS — R262 Difficulty in walking, not elsewhere classified: Secondary | ICD-10-CM | POA: Diagnosis not present

## 2016-06-23 DIAGNOSIS — M25652 Stiffness of left hip, not elsewhere classified: Secondary | ICD-10-CM | POA: Diagnosis not present

## 2016-06-23 DIAGNOSIS — M6281 Muscle weakness (generalized): Secondary | ICD-10-CM | POA: Diagnosis not present

## 2016-06-23 DIAGNOSIS — M549 Dorsalgia, unspecified: Secondary | ICD-10-CM | POA: Diagnosis not present

## 2016-06-24 DIAGNOSIS — M6281 Muscle weakness (generalized): Secondary | ICD-10-CM | POA: Diagnosis not present

## 2016-06-24 DIAGNOSIS — M549 Dorsalgia, unspecified: Secondary | ICD-10-CM | POA: Diagnosis not present

## 2016-06-24 DIAGNOSIS — R262 Difficulty in walking, not elsewhere classified: Secondary | ICD-10-CM | POA: Diagnosis not present

## 2016-06-24 DIAGNOSIS — M25652 Stiffness of left hip, not elsewhere classified: Secondary | ICD-10-CM | POA: Diagnosis not present

## 2016-06-29 DIAGNOSIS — M549 Dorsalgia, unspecified: Secondary | ICD-10-CM | POA: Diagnosis not present

## 2016-06-29 DIAGNOSIS — M6281 Muscle weakness (generalized): Secondary | ICD-10-CM | POA: Diagnosis not present

## 2016-06-29 DIAGNOSIS — R262 Difficulty in walking, not elsewhere classified: Secondary | ICD-10-CM | POA: Diagnosis not present

## 2016-06-29 DIAGNOSIS — M25652 Stiffness of left hip, not elsewhere classified: Secondary | ICD-10-CM | POA: Diagnosis not present

## 2016-07-01 DIAGNOSIS — M549 Dorsalgia, unspecified: Secondary | ICD-10-CM | POA: Diagnosis not present

## 2016-07-01 DIAGNOSIS — M6281 Muscle weakness (generalized): Secondary | ICD-10-CM | POA: Diagnosis not present

## 2016-07-01 DIAGNOSIS — M25652 Stiffness of left hip, not elsewhere classified: Secondary | ICD-10-CM | POA: Diagnosis not present

## 2016-07-01 DIAGNOSIS — R262 Difficulty in walking, not elsewhere classified: Secondary | ICD-10-CM | POA: Diagnosis not present

## 2016-07-02 DIAGNOSIS — Z23 Encounter for immunization: Secondary | ICD-10-CM | POA: Diagnosis not present

## 2016-07-06 DIAGNOSIS — R262 Difficulty in walking, not elsewhere classified: Secondary | ICD-10-CM | POA: Diagnosis not present

## 2016-07-06 DIAGNOSIS — M549 Dorsalgia, unspecified: Secondary | ICD-10-CM | POA: Diagnosis not present

## 2016-07-06 DIAGNOSIS — M25652 Stiffness of left hip, not elsewhere classified: Secondary | ICD-10-CM | POA: Diagnosis not present

## 2016-07-06 DIAGNOSIS — M6281 Muscle weakness (generalized): Secondary | ICD-10-CM | POA: Diagnosis not present

## 2016-07-08 DIAGNOSIS — M549 Dorsalgia, unspecified: Secondary | ICD-10-CM | POA: Diagnosis not present

## 2016-07-08 DIAGNOSIS — M6281 Muscle weakness (generalized): Secondary | ICD-10-CM | POA: Diagnosis not present

## 2016-07-08 DIAGNOSIS — R262 Difficulty in walking, not elsewhere classified: Secondary | ICD-10-CM | POA: Diagnosis not present

## 2016-07-08 DIAGNOSIS — M25652 Stiffness of left hip, not elsewhere classified: Secondary | ICD-10-CM | POA: Diagnosis not present

## 2016-07-13 DIAGNOSIS — E119 Type 2 diabetes mellitus without complications: Secondary | ICD-10-CM | POA: Diagnosis not present

## 2016-07-13 DIAGNOSIS — E7800 Pure hypercholesterolemia, unspecified: Secondary | ICD-10-CM | POA: Diagnosis not present

## 2016-07-14 DIAGNOSIS — R262 Difficulty in walking, not elsewhere classified: Secondary | ICD-10-CM | POA: Diagnosis not present

## 2016-07-14 DIAGNOSIS — M6281 Muscle weakness (generalized): Secondary | ICD-10-CM | POA: Diagnosis not present

## 2016-07-14 DIAGNOSIS — M25652 Stiffness of left hip, not elsewhere classified: Secondary | ICD-10-CM | POA: Diagnosis not present

## 2016-07-14 DIAGNOSIS — M549 Dorsalgia, unspecified: Secondary | ICD-10-CM | POA: Diagnosis not present

## 2016-07-15 DIAGNOSIS — R262 Difficulty in walking, not elsewhere classified: Secondary | ICD-10-CM | POA: Diagnosis not present

## 2016-07-15 DIAGNOSIS — M6281 Muscle weakness (generalized): Secondary | ICD-10-CM | POA: Diagnosis not present

## 2016-07-15 DIAGNOSIS — M549 Dorsalgia, unspecified: Secondary | ICD-10-CM | POA: Diagnosis not present

## 2016-07-15 DIAGNOSIS — M25652 Stiffness of left hip, not elsewhere classified: Secondary | ICD-10-CM | POA: Diagnosis not present

## 2016-07-30 DIAGNOSIS — H903 Sensorineural hearing loss, bilateral: Secondary | ICD-10-CM | POA: Diagnosis not present

## 2016-07-30 DIAGNOSIS — H9312 Tinnitus, left ear: Secondary | ICD-10-CM | POA: Diagnosis not present

## 2016-08-05 DIAGNOSIS — I10 Essential (primary) hypertension: Secondary | ICD-10-CM | POA: Diagnosis not present

## 2016-08-05 DIAGNOSIS — E1165 Type 2 diabetes mellitus with hyperglycemia: Secondary | ICD-10-CM | POA: Diagnosis not present

## 2016-08-05 DIAGNOSIS — J449 Chronic obstructive pulmonary disease, unspecified: Secondary | ICD-10-CM | POA: Diagnosis not present

## 2016-08-05 DIAGNOSIS — Z299 Encounter for prophylactic measures, unspecified: Secondary | ICD-10-CM | POA: Diagnosis not present

## 2016-08-05 DIAGNOSIS — G4700 Insomnia, unspecified: Secondary | ICD-10-CM | POA: Diagnosis not present

## 2016-08-19 DIAGNOSIS — L820 Inflamed seborrheic keratosis: Secondary | ICD-10-CM | POA: Diagnosis not present

## 2016-08-23 DIAGNOSIS — E7800 Pure hypercholesterolemia, unspecified: Secondary | ICD-10-CM | POA: Diagnosis not present

## 2016-08-23 DIAGNOSIS — E119 Type 2 diabetes mellitus without complications: Secondary | ICD-10-CM | POA: Diagnosis not present

## 2016-10-07 DIAGNOSIS — N10 Acute tubulo-interstitial nephritis: Secondary | ICD-10-CM | POA: Diagnosis not present

## 2016-10-07 DIAGNOSIS — E860 Dehydration: Secondary | ICD-10-CM | POA: Diagnosis not present

## 2016-10-07 DIAGNOSIS — E119 Type 2 diabetes mellitus without complications: Secondary | ICD-10-CM | POA: Diagnosis not present

## 2016-10-07 DIAGNOSIS — E1065 Type 1 diabetes mellitus with hyperglycemia: Secondary | ICD-10-CM | POA: Diagnosis not present

## 2016-10-07 DIAGNOSIS — R404 Transient alteration of awareness: Secondary | ICD-10-CM | POA: Diagnosis not present

## 2016-10-07 DIAGNOSIS — R109 Unspecified abdominal pain: Secondary | ICD-10-CM | POA: Diagnosis not present

## 2016-10-07 DIAGNOSIS — R531 Weakness: Secondary | ICD-10-CM | POA: Diagnosis not present

## 2016-10-07 DIAGNOSIS — E7800 Pure hypercholesterolemia, unspecified: Secondary | ICD-10-CM | POA: Diagnosis present

## 2016-10-07 DIAGNOSIS — R509 Fever, unspecified: Secondary | ICD-10-CM | POA: Diagnosis not present

## 2016-10-07 DIAGNOSIS — M47897 Other spondylosis, lumbosacral region: Secondary | ICD-10-CM | POA: Diagnosis not present

## 2016-10-07 DIAGNOSIS — I10 Essential (primary) hypertension: Secondary | ICD-10-CM | POA: Diagnosis present

## 2016-10-07 DIAGNOSIS — Z7982 Long term (current) use of aspirin: Secondary | ICD-10-CM | POA: Diagnosis not present

## 2016-10-07 DIAGNOSIS — R1111 Vomiting without nausea: Secondary | ICD-10-CM | POA: Diagnosis not present

## 2016-10-07 DIAGNOSIS — R309 Painful micturition, unspecified: Secondary | ICD-10-CM | POA: Diagnosis not present

## 2016-10-07 DIAGNOSIS — Z79899 Other long term (current) drug therapy: Secondary | ICD-10-CM | POA: Diagnosis not present

## 2016-10-07 DIAGNOSIS — N12 Tubulo-interstitial nephritis, not specified as acute or chronic: Secondary | ICD-10-CM | POA: Diagnosis not present

## 2016-10-13 DIAGNOSIS — Z1612 Extended spectrum beta lactamase (ESBL) resistance: Secondary | ICD-10-CM | POA: Diagnosis not present

## 2016-10-13 DIAGNOSIS — N10 Acute tubulo-interstitial nephritis: Secondary | ICD-10-CM | POA: Diagnosis not present

## 2016-10-13 DIAGNOSIS — G8929 Other chronic pain: Secondary | ICD-10-CM | POA: Diagnosis not present

## 2016-10-13 DIAGNOSIS — E119 Type 2 diabetes mellitus without complications: Secondary | ICD-10-CM | POA: Diagnosis not present

## 2016-10-13 DIAGNOSIS — B9629 Other Escherichia coli [E. coli] as the cause of diseases classified elsewhere: Secondary | ICD-10-CM | POA: Diagnosis not present

## 2016-10-13 DIAGNOSIS — I10 Essential (primary) hypertension: Secondary | ICD-10-CM | POA: Diagnosis not present

## 2016-10-14 DIAGNOSIS — N10 Acute tubulo-interstitial nephritis: Secondary | ICD-10-CM | POA: Diagnosis not present

## 2016-10-14 DIAGNOSIS — G8929 Other chronic pain: Secondary | ICD-10-CM | POA: Diagnosis not present

## 2016-10-14 DIAGNOSIS — I10 Essential (primary) hypertension: Secondary | ICD-10-CM | POA: Diagnosis not present

## 2016-10-14 DIAGNOSIS — B9629 Other Escherichia coli [E. coli] as the cause of diseases classified elsewhere: Secondary | ICD-10-CM | POA: Diagnosis not present

## 2016-10-14 DIAGNOSIS — E119 Type 2 diabetes mellitus without complications: Secondary | ICD-10-CM | POA: Diagnosis not present

## 2016-10-14 DIAGNOSIS — Z1612 Extended spectrum beta lactamase (ESBL) resistance: Secondary | ICD-10-CM | POA: Diagnosis not present

## 2016-10-15 DIAGNOSIS — Z1612 Extended spectrum beta lactamase (ESBL) resistance: Secondary | ICD-10-CM | POA: Diagnosis not present

## 2016-10-15 DIAGNOSIS — E119 Type 2 diabetes mellitus without complications: Secondary | ICD-10-CM | POA: Diagnosis not present

## 2016-10-15 DIAGNOSIS — I10 Essential (primary) hypertension: Secondary | ICD-10-CM | POA: Diagnosis not present

## 2016-10-15 DIAGNOSIS — G8929 Other chronic pain: Secondary | ICD-10-CM | POA: Diagnosis not present

## 2016-10-15 DIAGNOSIS — N10 Acute tubulo-interstitial nephritis: Secondary | ICD-10-CM | POA: Diagnosis not present

## 2016-10-15 DIAGNOSIS — B9629 Other Escherichia coli [E. coli] as the cause of diseases classified elsewhere: Secondary | ICD-10-CM | POA: Diagnosis not present

## 2016-10-18 DIAGNOSIS — N10 Acute tubulo-interstitial nephritis: Secondary | ICD-10-CM | POA: Diagnosis not present

## 2016-10-18 DIAGNOSIS — N390 Urinary tract infection, site not specified: Secondary | ICD-10-CM | POA: Diagnosis not present

## 2016-10-18 DIAGNOSIS — I10 Essential (primary) hypertension: Secondary | ICD-10-CM | POA: Diagnosis not present

## 2016-10-18 DIAGNOSIS — Z09 Encounter for follow-up examination after completed treatment for conditions other than malignant neoplasm: Secondary | ICD-10-CM | POA: Diagnosis not present

## 2016-10-18 DIAGNOSIS — E1165 Type 2 diabetes mellitus with hyperglycemia: Secondary | ICD-10-CM | POA: Diagnosis not present

## 2016-10-18 DIAGNOSIS — J449 Chronic obstructive pulmonary disease, unspecified: Secondary | ICD-10-CM | POA: Diagnosis not present

## 2016-10-18 DIAGNOSIS — Z299 Encounter for prophylactic measures, unspecified: Secondary | ICD-10-CM | POA: Diagnosis not present

## 2016-10-19 DIAGNOSIS — Z1612 Extended spectrum beta lactamase (ESBL) resistance: Secondary | ICD-10-CM | POA: Diagnosis not present

## 2016-10-19 DIAGNOSIS — B9629 Other Escherichia coli [E. coli] as the cause of diseases classified elsewhere: Secondary | ICD-10-CM | POA: Diagnosis not present

## 2016-10-19 DIAGNOSIS — I10 Essential (primary) hypertension: Secondary | ICD-10-CM | POA: Diagnosis not present

## 2016-10-19 DIAGNOSIS — N10 Acute tubulo-interstitial nephritis: Secondary | ICD-10-CM | POA: Diagnosis not present

## 2016-10-19 DIAGNOSIS — G8929 Other chronic pain: Secondary | ICD-10-CM | POA: Diagnosis not present

## 2016-10-19 DIAGNOSIS — E119 Type 2 diabetes mellitus without complications: Secondary | ICD-10-CM | POA: Diagnosis not present

## 2016-10-20 DIAGNOSIS — Z1612 Extended spectrum beta lactamase (ESBL) resistance: Secondary | ICD-10-CM | POA: Diagnosis not present

## 2016-10-20 DIAGNOSIS — G8929 Other chronic pain: Secondary | ICD-10-CM | POA: Diagnosis not present

## 2016-10-20 DIAGNOSIS — E119 Type 2 diabetes mellitus without complications: Secondary | ICD-10-CM | POA: Diagnosis not present

## 2016-10-20 DIAGNOSIS — N10 Acute tubulo-interstitial nephritis: Secondary | ICD-10-CM | POA: Diagnosis not present

## 2016-10-20 DIAGNOSIS — I10 Essential (primary) hypertension: Secondary | ICD-10-CM | POA: Diagnosis not present

## 2016-10-20 DIAGNOSIS — B9629 Other Escherichia coli [E. coli] as the cause of diseases classified elsewhere: Secondary | ICD-10-CM | POA: Diagnosis not present

## 2016-10-21 DIAGNOSIS — Z1612 Extended spectrum beta lactamase (ESBL) resistance: Secondary | ICD-10-CM | POA: Diagnosis not present

## 2016-10-21 DIAGNOSIS — N10 Acute tubulo-interstitial nephritis: Secondary | ICD-10-CM | POA: Diagnosis not present

## 2016-10-21 DIAGNOSIS — G8929 Other chronic pain: Secondary | ICD-10-CM | POA: Diagnosis not present

## 2016-10-21 DIAGNOSIS — B9629 Other Escherichia coli [E. coli] as the cause of diseases classified elsewhere: Secondary | ICD-10-CM | POA: Diagnosis not present

## 2016-10-21 DIAGNOSIS — E119 Type 2 diabetes mellitus without complications: Secondary | ICD-10-CM | POA: Diagnosis not present

## 2016-10-21 DIAGNOSIS — I10 Essential (primary) hypertension: Secondary | ICD-10-CM | POA: Diagnosis not present

## 2016-10-22 DIAGNOSIS — I10 Essential (primary) hypertension: Secondary | ICD-10-CM | POA: Diagnosis not present

## 2016-10-22 DIAGNOSIS — E119 Type 2 diabetes mellitus without complications: Secondary | ICD-10-CM | POA: Diagnosis not present

## 2016-10-22 DIAGNOSIS — Z1612 Extended spectrum beta lactamase (ESBL) resistance: Secondary | ICD-10-CM | POA: Diagnosis not present

## 2016-10-22 DIAGNOSIS — B9629 Other Escherichia coli [E. coli] as the cause of diseases classified elsewhere: Secondary | ICD-10-CM | POA: Diagnosis not present

## 2016-10-22 DIAGNOSIS — N10 Acute tubulo-interstitial nephritis: Secondary | ICD-10-CM | POA: Diagnosis not present

## 2016-10-22 DIAGNOSIS — G8929 Other chronic pain: Secondary | ICD-10-CM | POA: Diagnosis not present

## 2016-10-27 DIAGNOSIS — I10 Essential (primary) hypertension: Secondary | ICD-10-CM | POA: Diagnosis not present

## 2016-10-27 DIAGNOSIS — E119 Type 2 diabetes mellitus without complications: Secondary | ICD-10-CM | POA: Diagnosis not present

## 2016-10-27 DIAGNOSIS — Z1612 Extended spectrum beta lactamase (ESBL) resistance: Secondary | ICD-10-CM | POA: Diagnosis not present

## 2016-10-27 DIAGNOSIS — N10 Acute tubulo-interstitial nephritis: Secondary | ICD-10-CM | POA: Diagnosis not present

## 2016-10-27 DIAGNOSIS — G8929 Other chronic pain: Secondary | ICD-10-CM | POA: Diagnosis not present

## 2016-10-27 DIAGNOSIS — B9629 Other Escherichia coli [E. coli] as the cause of diseases classified elsewhere: Secondary | ICD-10-CM | POA: Diagnosis not present

## 2016-10-28 DIAGNOSIS — E119 Type 2 diabetes mellitus without complications: Secondary | ICD-10-CM | POA: Diagnosis not present

## 2016-10-28 DIAGNOSIS — Z1612 Extended spectrum beta lactamase (ESBL) resistance: Secondary | ICD-10-CM | POA: Diagnosis not present

## 2016-10-28 DIAGNOSIS — N10 Acute tubulo-interstitial nephritis: Secondary | ICD-10-CM | POA: Diagnosis not present

## 2016-10-28 DIAGNOSIS — B9629 Other Escherichia coli [E. coli] as the cause of diseases classified elsewhere: Secondary | ICD-10-CM | POA: Diagnosis not present

## 2016-10-28 DIAGNOSIS — G8929 Other chronic pain: Secondary | ICD-10-CM | POA: Diagnosis not present

## 2016-10-28 DIAGNOSIS — I10 Essential (primary) hypertension: Secondary | ICD-10-CM | POA: Diagnosis not present

## 2016-11-02 DIAGNOSIS — B9629 Other Escherichia coli [E. coli] as the cause of diseases classified elsewhere: Secondary | ICD-10-CM | POA: Diagnosis not present

## 2016-11-02 DIAGNOSIS — E119 Type 2 diabetes mellitus without complications: Secondary | ICD-10-CM | POA: Diagnosis not present

## 2016-11-02 DIAGNOSIS — I10 Essential (primary) hypertension: Secondary | ICD-10-CM | POA: Diagnosis not present

## 2016-11-02 DIAGNOSIS — Z1612 Extended spectrum beta lactamase (ESBL) resistance: Secondary | ICD-10-CM | POA: Diagnosis not present

## 2016-11-02 DIAGNOSIS — N10 Acute tubulo-interstitial nephritis: Secondary | ICD-10-CM | POA: Diagnosis not present

## 2016-11-02 DIAGNOSIS — G8929 Other chronic pain: Secondary | ICD-10-CM | POA: Diagnosis not present

## 2016-11-03 DIAGNOSIS — B9629 Other Escherichia coli [E. coli] as the cause of diseases classified elsewhere: Secondary | ICD-10-CM | POA: Diagnosis not present

## 2016-11-03 DIAGNOSIS — Z1612 Extended spectrum beta lactamase (ESBL) resistance: Secondary | ICD-10-CM | POA: Diagnosis not present

## 2016-11-03 DIAGNOSIS — N10 Acute tubulo-interstitial nephritis: Secondary | ICD-10-CM | POA: Diagnosis not present

## 2016-11-03 DIAGNOSIS — I10 Essential (primary) hypertension: Secondary | ICD-10-CM | POA: Diagnosis not present

## 2016-11-03 DIAGNOSIS — E119 Type 2 diabetes mellitus without complications: Secondary | ICD-10-CM | POA: Diagnosis not present

## 2016-11-03 DIAGNOSIS — G8929 Other chronic pain: Secondary | ICD-10-CM | POA: Diagnosis not present

## 2016-11-05 DIAGNOSIS — G8929 Other chronic pain: Secondary | ICD-10-CM | POA: Diagnosis not present

## 2016-11-05 DIAGNOSIS — I10 Essential (primary) hypertension: Secondary | ICD-10-CM | POA: Diagnosis not present

## 2016-11-05 DIAGNOSIS — Z1612 Extended spectrum beta lactamase (ESBL) resistance: Secondary | ICD-10-CM | POA: Diagnosis not present

## 2016-11-05 DIAGNOSIS — N10 Acute tubulo-interstitial nephritis: Secondary | ICD-10-CM | POA: Diagnosis not present

## 2016-11-05 DIAGNOSIS — E119 Type 2 diabetes mellitus without complications: Secondary | ICD-10-CM | POA: Diagnosis not present

## 2016-11-05 DIAGNOSIS — B9629 Other Escherichia coli [E. coli] as the cause of diseases classified elsewhere: Secondary | ICD-10-CM | POA: Diagnosis not present

## 2016-11-09 DIAGNOSIS — I10 Essential (primary) hypertension: Secondary | ICD-10-CM | POA: Diagnosis not present

## 2016-11-09 DIAGNOSIS — Z1612 Extended spectrum beta lactamase (ESBL) resistance: Secondary | ICD-10-CM | POA: Diagnosis not present

## 2016-11-09 DIAGNOSIS — G8929 Other chronic pain: Secondary | ICD-10-CM | POA: Diagnosis not present

## 2016-11-09 DIAGNOSIS — B9629 Other Escherichia coli [E. coli] as the cause of diseases classified elsewhere: Secondary | ICD-10-CM | POA: Diagnosis not present

## 2016-11-09 DIAGNOSIS — N10 Acute tubulo-interstitial nephritis: Secondary | ICD-10-CM | POA: Diagnosis not present

## 2016-11-09 DIAGNOSIS — E119 Type 2 diabetes mellitus without complications: Secondary | ICD-10-CM | POA: Diagnosis not present

## 2016-11-10 DIAGNOSIS — B9629 Other Escherichia coli [E. coli] as the cause of diseases classified elsewhere: Secondary | ICD-10-CM | POA: Diagnosis not present

## 2016-11-10 DIAGNOSIS — I10 Essential (primary) hypertension: Secondary | ICD-10-CM | POA: Diagnosis not present

## 2016-11-10 DIAGNOSIS — Z1612 Extended spectrum beta lactamase (ESBL) resistance: Secondary | ICD-10-CM | POA: Diagnosis not present

## 2016-11-10 DIAGNOSIS — G8929 Other chronic pain: Secondary | ICD-10-CM | POA: Diagnosis not present

## 2016-11-10 DIAGNOSIS — N10 Acute tubulo-interstitial nephritis: Secondary | ICD-10-CM | POA: Diagnosis not present

## 2016-11-10 DIAGNOSIS — E119 Type 2 diabetes mellitus without complications: Secondary | ICD-10-CM | POA: Diagnosis not present

## 2016-11-11 DIAGNOSIS — I10 Essential (primary) hypertension: Secondary | ICD-10-CM | POA: Diagnosis not present

## 2016-11-11 DIAGNOSIS — G8929 Other chronic pain: Secondary | ICD-10-CM | POA: Diagnosis not present

## 2016-11-11 DIAGNOSIS — Z1612 Extended spectrum beta lactamase (ESBL) resistance: Secondary | ICD-10-CM | POA: Diagnosis not present

## 2016-11-11 DIAGNOSIS — E119 Type 2 diabetes mellitus without complications: Secondary | ICD-10-CM | POA: Diagnosis not present

## 2016-11-11 DIAGNOSIS — N10 Acute tubulo-interstitial nephritis: Secondary | ICD-10-CM | POA: Diagnosis not present

## 2016-11-11 DIAGNOSIS — B9629 Other Escherichia coli [E. coli] as the cause of diseases classified elsewhere: Secondary | ICD-10-CM | POA: Diagnosis not present

## 2016-11-15 DIAGNOSIS — E119 Type 2 diabetes mellitus without complications: Secondary | ICD-10-CM | POA: Diagnosis not present

## 2016-11-15 DIAGNOSIS — I10 Essential (primary) hypertension: Secondary | ICD-10-CM | POA: Diagnosis not present

## 2016-11-15 DIAGNOSIS — N10 Acute tubulo-interstitial nephritis: Secondary | ICD-10-CM | POA: Diagnosis not present

## 2016-11-15 DIAGNOSIS — Z1612 Extended spectrum beta lactamase (ESBL) resistance: Secondary | ICD-10-CM | POA: Diagnosis not present

## 2016-11-15 DIAGNOSIS — G8929 Other chronic pain: Secondary | ICD-10-CM | POA: Diagnosis not present

## 2016-11-15 DIAGNOSIS — B9629 Other Escherichia coli [E. coli] as the cause of diseases classified elsewhere: Secondary | ICD-10-CM | POA: Diagnosis not present

## 2016-11-17 DIAGNOSIS — I10 Essential (primary) hypertension: Secondary | ICD-10-CM | POA: Diagnosis not present

## 2016-11-17 DIAGNOSIS — N10 Acute tubulo-interstitial nephritis: Secondary | ICD-10-CM | POA: Diagnosis not present

## 2016-11-17 DIAGNOSIS — Z1612 Extended spectrum beta lactamase (ESBL) resistance: Secondary | ICD-10-CM | POA: Diagnosis not present

## 2016-11-17 DIAGNOSIS — E119 Type 2 diabetes mellitus without complications: Secondary | ICD-10-CM | POA: Diagnosis not present

## 2016-11-17 DIAGNOSIS — B9629 Other Escherichia coli [E. coli] as the cause of diseases classified elsewhere: Secondary | ICD-10-CM | POA: Diagnosis not present

## 2016-11-17 DIAGNOSIS — G8929 Other chronic pain: Secondary | ICD-10-CM | POA: Diagnosis not present

## 2016-11-23 DIAGNOSIS — N10 Acute tubulo-interstitial nephritis: Secondary | ICD-10-CM | POA: Diagnosis not present

## 2016-11-23 DIAGNOSIS — G8929 Other chronic pain: Secondary | ICD-10-CM | POA: Diagnosis not present

## 2016-11-23 DIAGNOSIS — Z1612 Extended spectrum beta lactamase (ESBL) resistance: Secondary | ICD-10-CM | POA: Diagnosis not present

## 2016-11-23 DIAGNOSIS — B9629 Other Escherichia coli [E. coli] as the cause of diseases classified elsewhere: Secondary | ICD-10-CM | POA: Diagnosis not present

## 2016-11-23 DIAGNOSIS — E119 Type 2 diabetes mellitus without complications: Secondary | ICD-10-CM | POA: Diagnosis not present

## 2016-11-23 DIAGNOSIS — I10 Essential (primary) hypertension: Secondary | ICD-10-CM | POA: Diagnosis not present

## 2016-11-26 DIAGNOSIS — Z1612 Extended spectrum beta lactamase (ESBL) resistance: Secondary | ICD-10-CM | POA: Diagnosis not present

## 2016-11-26 DIAGNOSIS — G8929 Other chronic pain: Secondary | ICD-10-CM | POA: Diagnosis not present

## 2016-11-26 DIAGNOSIS — N10 Acute tubulo-interstitial nephritis: Secondary | ICD-10-CM | POA: Diagnosis not present

## 2016-11-26 DIAGNOSIS — B9629 Other Escherichia coli [E. coli] as the cause of diseases classified elsewhere: Secondary | ICD-10-CM | POA: Diagnosis not present

## 2016-11-26 DIAGNOSIS — E119 Type 2 diabetes mellitus without complications: Secondary | ICD-10-CM | POA: Diagnosis not present

## 2016-11-26 DIAGNOSIS — I10 Essential (primary) hypertension: Secondary | ICD-10-CM | POA: Diagnosis not present

## 2016-11-30 DIAGNOSIS — I10 Essential (primary) hypertension: Secondary | ICD-10-CM | POA: Diagnosis not present

## 2016-11-30 DIAGNOSIS — N10 Acute tubulo-interstitial nephritis: Secondary | ICD-10-CM | POA: Diagnosis not present

## 2016-11-30 DIAGNOSIS — Z1612 Extended spectrum beta lactamase (ESBL) resistance: Secondary | ICD-10-CM | POA: Diagnosis not present

## 2016-11-30 DIAGNOSIS — E119 Type 2 diabetes mellitus without complications: Secondary | ICD-10-CM | POA: Diagnosis not present

## 2016-11-30 DIAGNOSIS — B9629 Other Escherichia coli [E. coli] as the cause of diseases classified elsewhere: Secondary | ICD-10-CM | POA: Diagnosis not present

## 2016-11-30 DIAGNOSIS — G8929 Other chronic pain: Secondary | ICD-10-CM | POA: Diagnosis not present

## 2016-12-02 DIAGNOSIS — E119 Type 2 diabetes mellitus without complications: Secondary | ICD-10-CM | POA: Diagnosis not present

## 2016-12-02 DIAGNOSIS — B9629 Other Escherichia coli [E. coli] as the cause of diseases classified elsewhere: Secondary | ICD-10-CM | POA: Diagnosis not present

## 2016-12-02 DIAGNOSIS — N10 Acute tubulo-interstitial nephritis: Secondary | ICD-10-CM | POA: Diagnosis not present

## 2016-12-02 DIAGNOSIS — I10 Essential (primary) hypertension: Secondary | ICD-10-CM | POA: Diagnosis not present

## 2016-12-02 DIAGNOSIS — G8929 Other chronic pain: Secondary | ICD-10-CM | POA: Diagnosis not present

## 2016-12-02 DIAGNOSIS — Z1612 Extended spectrum beta lactamase (ESBL) resistance: Secondary | ICD-10-CM | POA: Diagnosis not present

## 2016-12-03 DIAGNOSIS — E1142 Type 2 diabetes mellitus with diabetic polyneuropathy: Secondary | ICD-10-CM | POA: Diagnosis not present

## 2016-12-03 DIAGNOSIS — E7800 Pure hypercholesterolemia, unspecified: Secondary | ICD-10-CM | POA: Diagnosis not present

## 2016-12-03 DIAGNOSIS — I10 Essential (primary) hypertension: Secondary | ICD-10-CM | POA: Diagnosis not present

## 2016-12-03 DIAGNOSIS — Z299 Encounter for prophylactic measures, unspecified: Secondary | ICD-10-CM | POA: Diagnosis not present

## 2016-12-03 DIAGNOSIS — J449 Chronic obstructive pulmonary disease, unspecified: Secondary | ICD-10-CM | POA: Diagnosis not present

## 2016-12-03 DIAGNOSIS — Z713 Dietary counseling and surveillance: Secondary | ICD-10-CM | POA: Diagnosis not present

## 2016-12-07 DIAGNOSIS — B9629 Other Escherichia coli [E. coli] as the cause of diseases classified elsewhere: Secondary | ICD-10-CM | POA: Diagnosis not present

## 2016-12-07 DIAGNOSIS — Z1612 Extended spectrum beta lactamase (ESBL) resistance: Secondary | ICD-10-CM | POA: Diagnosis not present

## 2016-12-07 DIAGNOSIS — N10 Acute tubulo-interstitial nephritis: Secondary | ICD-10-CM | POA: Diagnosis not present

## 2016-12-07 DIAGNOSIS — G8929 Other chronic pain: Secondary | ICD-10-CM | POA: Diagnosis not present

## 2016-12-07 DIAGNOSIS — E119 Type 2 diabetes mellitus without complications: Secondary | ICD-10-CM | POA: Diagnosis not present

## 2016-12-07 DIAGNOSIS — I10 Essential (primary) hypertension: Secondary | ICD-10-CM | POA: Diagnosis not present

## 2016-12-09 DIAGNOSIS — I10 Essential (primary) hypertension: Secondary | ICD-10-CM | POA: Diagnosis not present

## 2016-12-09 DIAGNOSIS — B9629 Other Escherichia coli [E. coli] as the cause of diseases classified elsewhere: Secondary | ICD-10-CM | POA: Diagnosis not present

## 2016-12-09 DIAGNOSIS — E119 Type 2 diabetes mellitus without complications: Secondary | ICD-10-CM | POA: Diagnosis not present

## 2016-12-09 DIAGNOSIS — Z1612 Extended spectrum beta lactamase (ESBL) resistance: Secondary | ICD-10-CM | POA: Diagnosis not present

## 2016-12-09 DIAGNOSIS — N10 Acute tubulo-interstitial nephritis: Secondary | ICD-10-CM | POA: Diagnosis not present

## 2016-12-09 DIAGNOSIS — G8929 Other chronic pain: Secondary | ICD-10-CM | POA: Diagnosis not present

## 2016-12-09 DIAGNOSIS — H40053 Ocular hypertension, bilateral: Secondary | ICD-10-CM | POA: Diagnosis not present

## 2016-12-09 DIAGNOSIS — H524 Presbyopia: Secondary | ICD-10-CM | POA: Diagnosis not present

## 2017-01-06 DIAGNOSIS — E119 Type 2 diabetes mellitus without complications: Secondary | ICD-10-CM | POA: Diagnosis not present

## 2017-01-06 DIAGNOSIS — E7800 Pure hypercholesterolemia, unspecified: Secondary | ICD-10-CM | POA: Diagnosis not present

## 2017-01-28 DIAGNOSIS — M545 Low back pain: Secondary | ICD-10-CM | POA: Diagnosis not present

## 2017-01-28 DIAGNOSIS — Z299 Encounter for prophylactic measures, unspecified: Secondary | ICD-10-CM | POA: Diagnosis not present

## 2017-01-28 DIAGNOSIS — Z713 Dietary counseling and surveillance: Secondary | ICD-10-CM | POA: Diagnosis not present

## 2017-01-28 DIAGNOSIS — Z6827 Body mass index (BMI) 27.0-27.9, adult: Secondary | ICD-10-CM | POA: Diagnosis not present

## 2017-01-28 DIAGNOSIS — Z79899 Other long term (current) drug therapy: Secondary | ICD-10-CM | POA: Diagnosis not present

## 2017-02-16 DIAGNOSIS — E7800 Pure hypercholesterolemia, unspecified: Secondary | ICD-10-CM | POA: Diagnosis not present

## 2017-02-16 DIAGNOSIS — E119 Type 2 diabetes mellitus without complications: Secondary | ICD-10-CM | POA: Diagnosis not present

## 2017-02-18 DIAGNOSIS — Z7189 Other specified counseling: Secondary | ICD-10-CM | POA: Diagnosis not present

## 2017-02-18 DIAGNOSIS — Z0000 Encounter for general adult medical examination without abnormal findings: Secondary | ICD-10-CM | POA: Diagnosis not present

## 2017-02-18 DIAGNOSIS — Z79899 Other long term (current) drug therapy: Secondary | ICD-10-CM | POA: Diagnosis not present

## 2017-02-18 DIAGNOSIS — J449 Chronic obstructive pulmonary disease, unspecified: Secondary | ICD-10-CM | POA: Diagnosis not present

## 2017-02-18 DIAGNOSIS — Z125 Encounter for screening for malignant neoplasm of prostate: Secondary | ICD-10-CM | POA: Diagnosis not present

## 2017-02-18 DIAGNOSIS — Z299 Encounter for prophylactic measures, unspecified: Secondary | ICD-10-CM | POA: Diagnosis not present

## 2017-02-18 DIAGNOSIS — Z1389 Encounter for screening for other disorder: Secondary | ICD-10-CM | POA: Diagnosis not present

## 2017-02-18 DIAGNOSIS — Z6827 Body mass index (BMI) 27.0-27.9, adult: Secondary | ICD-10-CM | POA: Diagnosis not present

## 2017-02-18 DIAGNOSIS — E1142 Type 2 diabetes mellitus with diabetic polyneuropathy: Secondary | ICD-10-CM | POA: Diagnosis not present

## 2017-02-18 DIAGNOSIS — B356 Tinea cruris: Secondary | ICD-10-CM | POA: Diagnosis not present

## 2017-02-18 DIAGNOSIS — E1165 Type 2 diabetes mellitus with hyperglycemia: Secondary | ICD-10-CM | POA: Diagnosis not present

## 2017-02-18 DIAGNOSIS — Z1211 Encounter for screening for malignant neoplasm of colon: Secondary | ICD-10-CM | POA: Diagnosis not present

## 2017-02-18 DIAGNOSIS — E7800 Pure hypercholesterolemia, unspecified: Secondary | ICD-10-CM | POA: Diagnosis not present

## 2017-02-18 DIAGNOSIS — R5383 Other fatigue: Secondary | ICD-10-CM | POA: Diagnosis not present

## 2017-02-18 DIAGNOSIS — I10 Essential (primary) hypertension: Secondary | ICD-10-CM | POA: Diagnosis not present

## 2017-03-04 DIAGNOSIS — Z299 Encounter for prophylactic measures, unspecified: Secondary | ICD-10-CM | POA: Diagnosis not present

## 2017-03-04 DIAGNOSIS — Z6827 Body mass index (BMI) 27.0-27.9, adult: Secondary | ICD-10-CM | POA: Diagnosis not present

## 2017-03-04 DIAGNOSIS — C44222 Squamous cell carcinoma of skin of right ear and external auricular canal: Secondary | ICD-10-CM | POA: Diagnosis not present

## 2017-03-04 DIAGNOSIS — I10 Essential (primary) hypertension: Secondary | ICD-10-CM | POA: Diagnosis not present

## 2017-03-08 DIAGNOSIS — C44222 Squamous cell carcinoma of skin of right ear and external auricular canal: Secondary | ICD-10-CM | POA: Diagnosis not present

## 2017-03-08 DIAGNOSIS — L989 Disorder of the skin and subcutaneous tissue, unspecified: Secondary | ICD-10-CM | POA: Diagnosis not present

## 2017-03-09 ENCOUNTER — Encounter (INDEPENDENT_AMBULATORY_CARE_PROVIDER_SITE_OTHER): Payer: Self-pay

## 2017-03-09 ENCOUNTER — Encounter (INDEPENDENT_AMBULATORY_CARE_PROVIDER_SITE_OTHER): Payer: Self-pay | Admitting: Internal Medicine

## 2017-03-21 DIAGNOSIS — C4491 Basal cell carcinoma of skin, unspecified: Secondary | ICD-10-CM | POA: Diagnosis not present

## 2017-03-21 DIAGNOSIS — Z6826 Body mass index (BMI) 26.0-26.9, adult: Secondary | ICD-10-CM | POA: Diagnosis not present

## 2017-03-21 DIAGNOSIS — C44222 Squamous cell carcinoma of skin of right ear and external auricular canal: Secondary | ICD-10-CM | POA: Diagnosis not present

## 2017-03-21 DIAGNOSIS — Z299 Encounter for prophylactic measures, unspecified: Secondary | ICD-10-CM | POA: Diagnosis not present

## 2017-03-24 ENCOUNTER — Ambulatory Visit (INDEPENDENT_AMBULATORY_CARE_PROVIDER_SITE_OTHER): Payer: Medicare Other | Admitting: Internal Medicine

## 2017-03-24 ENCOUNTER — Encounter (INDEPENDENT_AMBULATORY_CARE_PROVIDER_SITE_OTHER): Payer: Self-pay | Admitting: Internal Medicine

## 2017-03-24 VITALS — BP 170/58 | HR 52 | Temp 97.9°F | Ht 71.0 in | Wt 190.2 lb

## 2017-03-24 DIAGNOSIS — R195 Other fecal abnormalities: Secondary | ICD-10-CM

## 2017-03-24 DIAGNOSIS — I10 Essential (primary) hypertension: Secondary | ICD-10-CM

## 2017-03-24 DIAGNOSIS — M1990 Unspecified osteoarthritis, unspecified site: Secondary | ICD-10-CM

## 2017-03-24 DIAGNOSIS — E7800 Pure hypercholesterolemia, unspecified: Secondary | ICD-10-CM

## 2017-03-24 HISTORY — DX: Essential (primary) hypertension: I10

## 2017-03-24 HISTORY — DX: Pure hypercholesterolemia, unspecified: E78.00

## 2017-03-24 HISTORY — DX: Unspecified osteoarthritis, unspecified site: M19.90

## 2017-03-24 NOTE — Patient Instructions (Signed)
3 stool cards home with patient. I am going to discuss with Dr. Laural Golden after he turns his stool cards im.

## 2017-03-24 NOTE — Progress Notes (Signed)
Subjective:    Patient ID: William [name], male    DOB: Jun 11, 1941, 76 y.o.   MRN: [id number]  HPI  Referred by Dr. Woody Seller for guaiac positive stool.  Patient denies seeing any blood. No melena or BRRB. He does tell me he has had some rectal itching and has been using Preparation H.  No change in his bowel. No abdominal pain. Does have some back pain.  Appetite is good. No weight loss. Denies acid reflux. No dysphagia.  No family hx of colon cancer. Takes Diclofenac 75mg  BID for his chronic back pain.   Last colonoscopy in 2014 (screening) and was normal except for small hemorrhoids (external) below dentate ine and single anal papilla.  02/19/2017 H and H  13.7 and 40.2   Review of Systems Past Medical History:  Diagnosis Date  . Arthritis   . Arthritis 03/24/2017  . Cancer ([place]) 2014   skin ca, basal cell  . Diabetes mellitus    borderline controlled with diet, checks hemaglobin A 1 C every 3-4 months  . Essential hypertension, benign 03/24/2017  . Glaucoma (increased eye pressure)    bilaterally  . Hearing difficulty    "has ringing in left ear"  . High cholesterol 03/24/2017  . Hyperlipemia   . Neuromuscular disorder (HCC)    numbness/tingling lower extremeties    Past Surgical History:  Procedure Laterality Date  . ACETABULAR REVISION Left 06/03/2014   Procedure: LEFT ACETABULAR REVISION;  Surgeon: Mauri Pole, MD;  Location: [facility];  Service: Orthopedics;  Laterality: Left;  . APPENDECTOMY  1960  . BACK SURGERY    also done july 2013   low back surgery right side 1997  . COLONOSCOPY  10/05/2012   Procedure: COLONOSCOPY;  Surgeon: Rogene Houston, MD;  [facility]: [facility];  Service: Endoscopy;  Laterality: N/A;  830  . JOINT REPLACEMENT    . PARTIAL HIP ARTHROPLASTY  2002   x 1  . SKIN CANCER EXCISION    . TONSILLECTOMY  as child  . TOTAL HIP ARTHROPLASTY Left    x 2  . TOTAL HIP ARTHROPLASTY Right 2006    No Known Allergies  Current Outpatient Prescriptions  on File Prior to Visit  Medication Sig Dispense Refill  . cetirizine (ZYRTEC) 10 MG tablet Take 10 mg by mouth daily as needed. For allergies    . gabapentin (NEURONTIN) 300 MG capsule Take 300 mg by mouth 3 (three) times daily.    Marland Kitchen latanoprost (XALATAN) 0.005 % ophthalmic solution Place 1 drop into both eyes at bedtime.    Marland Kitchen Lysine 500 MG CAPS Take 1 capsule by mouth daily.    . Multiple Vitamin (MULTIVITAMIN WITH MINERALS) TABS Take 1 tablet by mouth daily.     No current facility-administered medications on file prior to visit.         Objective:   Physical Exam Blood pressure (!) 170/58, pulse (!) 52, temperature 97.9 F (36.6 C), height 5\' 11"  (1.803 m), weight 190 lb 3.2 oz (86.3 kg). Alert and oriented. Skin warm and dry. Oral mucosa is moist.   . Sclera anicteric, conjunctivae is pink. Thyroid not enlarged. No cervical lymphadenopathy. Lungs clear. Heart regular rate and rhythm.  Abdomen is soft. Bowel sounds are positive. No hepatomegaly. No abdominal masses felt. No tenderness.  No edema to lower extremities.           Assessment & Plan:  Guaiac positive stool.   Hemoglobin appears stable. Last colonoscopy in  2014. Am going to discuss with Dr. Laural Golden. May need an EGD.

## 2017-03-28 ENCOUNTER — Encounter (INDEPENDENT_AMBULATORY_CARE_PROVIDER_SITE_OTHER): Payer: Self-pay

## 2017-03-30 ENCOUNTER — Other Ambulatory Visit (INDEPENDENT_AMBULATORY_CARE_PROVIDER_SITE_OTHER): Payer: Self-pay | Admitting: *Deleted

## 2017-03-30 ENCOUNTER — Telehealth (INDEPENDENT_AMBULATORY_CARE_PROVIDER_SITE_OTHER): Payer: Self-pay | Admitting: Internal Medicine

## 2017-03-30 DIAGNOSIS — E119 Type 2 diabetes mellitus without complications: Secondary | ICD-10-CM | POA: Diagnosis not present

## 2017-03-30 DIAGNOSIS — R195 Other fecal abnormalities: Secondary | ICD-10-CM

## 2017-03-30 DIAGNOSIS — E7800 Pure hypercholesterolemia, unspecified: Secondary | ICD-10-CM | POA: Diagnosis not present

## 2017-03-30 NOTE — Telephone Encounter (Signed)
Lab is noted for 3 months and a letter is being sent.

## 2017-03-30 NOTE — Telephone Encounter (Signed)
Will check stool cards

## 2017-03-30 NOTE — Telephone Encounter (Signed)
Tammy, CBC in 3 months. Please send letter.    I discussed with Dr. Laural Golden.

## 2017-03-30 NOTE — Telephone Encounter (Signed)
I discussed this case with Dr Laural Golden. CBC in 3 months.      William [name], CBC in 3 months.

## 2017-03-31 ENCOUNTER — Telehealth (INDEPENDENT_AMBULATORY_CARE_PROVIDER_SITE_OTHER): Payer: Self-pay | Admitting: *Deleted

## 2017-03-31 NOTE — Telephone Encounter (Signed)
err

## 2017-03-31 NOTE — Telephone Encounter (Signed)
   Diagnosis:    Result(s)   Card 1:Negative: 03/25/2017    Card 2:Negative:03/27/2017   Card 3: Negative:03/29/2017   Completed by: Thomas Hoff , LPN   HEMOCCULT SENSA DEVELOPER: LOT#:  [id number] EXPIRATION DATE: 2020-05   HEMOCCULT SENSA CARD:  LOT#:  [id number] EXPIRATION DATE: 05/20   CARD CONTROL RESULTS:  POSITIVE: Positive NEGATIVE: Negative    ADDITIONAL COMMENTS: Results were forwarded to Lelon Perla ordering provider.

## 2017-04-01 NOTE — Telephone Encounter (Signed)
Results given to patient. CBC in 2 months

## 2017-05-06 DIAGNOSIS — E1165 Type 2 diabetes mellitus with hyperglycemia: Secondary | ICD-10-CM | POA: Diagnosis not present

## 2017-05-06 DIAGNOSIS — E7800 Pure hypercholesterolemia, unspecified: Secondary | ICD-10-CM | POA: Diagnosis not present

## 2017-05-06 DIAGNOSIS — Z6827 Body mass index (BMI) 27.0-27.9, adult: Secondary | ICD-10-CM | POA: Diagnosis not present

## 2017-05-06 DIAGNOSIS — Z299 Encounter for prophylactic measures, unspecified: Secondary | ICD-10-CM | POA: Diagnosis not present

## 2017-05-06 DIAGNOSIS — J449 Chronic obstructive pulmonary disease, unspecified: Secondary | ICD-10-CM | POA: Diagnosis not present

## 2017-05-06 DIAGNOSIS — I10 Essential (primary) hypertension: Secondary | ICD-10-CM | POA: Diagnosis not present

## 2017-05-06 DIAGNOSIS — Z713 Dietary counseling and surveillance: Secondary | ICD-10-CM | POA: Diagnosis not present

## 2017-05-06 DIAGNOSIS — M545 Low back pain: Secondary | ICD-10-CM | POA: Diagnosis not present

## 2017-05-06 DIAGNOSIS — E1142 Type 2 diabetes mellitus with diabetic polyneuropathy: Secondary | ICD-10-CM | POA: Diagnosis not present

## 2017-06-16 ENCOUNTER — Encounter (INDEPENDENT_AMBULATORY_CARE_PROVIDER_SITE_OTHER): Payer: Self-pay | Admitting: *Deleted

## 2017-06-16 ENCOUNTER — Other Ambulatory Visit (INDEPENDENT_AMBULATORY_CARE_PROVIDER_SITE_OTHER): Payer: Self-pay | Admitting: *Deleted

## 2017-06-16 DIAGNOSIS — R195 Other fecal abnormalities: Secondary | ICD-10-CM

## 2017-06-20 DIAGNOSIS — E119 Type 2 diabetes mellitus without complications: Secondary | ICD-10-CM | POA: Diagnosis not present

## 2017-06-20 DIAGNOSIS — E7800 Pure hypercholesterolemia, unspecified: Secondary | ICD-10-CM | POA: Diagnosis not present

## 2017-06-30 DIAGNOSIS — R195 Other fecal abnormalities: Secondary | ICD-10-CM | POA: Diagnosis not present

## 2017-07-01 LAB — CBC
Hematocrit: 37.3 % — ABNORMAL LOW (ref 37.5–51.0)
Hemoglobin: 12.8 g/dL — ABNORMAL LOW (ref 13.0–17.7)
MCH: 32.3 pg (ref 26.6–33.0)
MCHC: 34.3 g/dL (ref 31.5–35.7)
MCV: 94 fL (ref 79–97)
PLATELETS: 213 10*3/uL (ref 150–379)
RBC: 3.96 x10E6/uL — AB (ref 4.14–5.80)
RDW: 12.7 % (ref 12.3–15.4)
WBC: 4.4 10*3/uL (ref 3.4–10.8)

## 2017-07-06 ENCOUNTER — Other Ambulatory Visit (INDEPENDENT_AMBULATORY_CARE_PROVIDER_SITE_OTHER): Payer: Self-pay | Admitting: *Deleted

## 2017-07-06 DIAGNOSIS — R195 Other fecal abnormalities: Secondary | ICD-10-CM

## 2017-07-20 DIAGNOSIS — Z23 Encounter for immunization: Secondary | ICD-10-CM | POA: Diagnosis not present

## 2017-07-26 DIAGNOSIS — H40013 Open angle with borderline findings, low risk, bilateral: Secondary | ICD-10-CM | POA: Diagnosis not present

## 2017-08-15 DIAGNOSIS — I10 Essential (primary) hypertension: Secondary | ICD-10-CM | POA: Diagnosis not present

## 2017-08-15 DIAGNOSIS — Z6828 Body mass index (BMI) 28.0-28.9, adult: Secondary | ICD-10-CM | POA: Diagnosis not present

## 2017-08-15 DIAGNOSIS — J449 Chronic obstructive pulmonary disease, unspecified: Secondary | ICD-10-CM | POA: Diagnosis not present

## 2017-08-15 DIAGNOSIS — E1165 Type 2 diabetes mellitus with hyperglycemia: Secondary | ICD-10-CM | POA: Diagnosis not present

## 2017-08-15 DIAGNOSIS — Z299 Encounter for prophylactic measures, unspecified: Secondary | ICD-10-CM | POA: Diagnosis not present

## 2017-08-23 DIAGNOSIS — E7800 Pure hypercholesterolemia, unspecified: Secondary | ICD-10-CM | POA: Diagnosis not present

## 2017-08-23 DIAGNOSIS — E119 Type 2 diabetes mellitus without complications: Secondary | ICD-10-CM | POA: Diagnosis not present

## 2017-09-16 ENCOUNTER — Other Ambulatory Visit (INDEPENDENT_AMBULATORY_CARE_PROVIDER_SITE_OTHER): Payer: Self-pay | Admitting: *Deleted

## 2017-09-16 ENCOUNTER — Encounter (INDEPENDENT_AMBULATORY_CARE_PROVIDER_SITE_OTHER): Payer: Self-pay | Admitting: *Deleted

## 2017-09-16 DIAGNOSIS — R195 Other fecal abnormalities: Secondary | ICD-10-CM

## 2017-09-19 DIAGNOSIS — J449 Chronic obstructive pulmonary disease, unspecified: Secondary | ICD-10-CM | POA: Diagnosis not present

## 2017-09-19 DIAGNOSIS — Z6828 Body mass index (BMI) 28.0-28.9, adult: Secondary | ICD-10-CM | POA: Diagnosis not present

## 2017-09-19 DIAGNOSIS — Z789 Other specified health status: Secondary | ICD-10-CM | POA: Diagnosis not present

## 2017-09-19 DIAGNOSIS — Z299 Encounter for prophylactic measures, unspecified: Secondary | ICD-10-CM | POA: Diagnosis not present

## 2017-09-19 DIAGNOSIS — M545 Low back pain: Secondary | ICD-10-CM | POA: Diagnosis not present

## 2017-09-19 DIAGNOSIS — I10 Essential (primary) hypertension: Secondary | ICD-10-CM | POA: Diagnosis not present

## 2017-09-19 IMAGING — CT CT L SPINE W/ CM
1 of 6 series · 6 of 14 positions shown, 8 images · IV contrast (omnipaque)
Comparison: Preop CT myelogram 03/09/2012

CLINICAL DATA: Previous lumbar fusion for spinal stenosis.
Progressive lower extremity weakness

EXAM:
LUMBAR MYELOGRAM
CT LUMBAR SPINE WITH INTRATHECAL CONTRAST
FLUOROSCOPY TIME:  33 seconds
TECHNIQUE: The procedure, risks (including but not limited to bleeding,
infection, organ damage ), benefits, and alternatives were explained
to the patient. Questions regarding the procedure were encouraged
and answered. The patient understands and consents to the procedure.
An appropriate entry site was determined under fluoroscopy. Operator
donned sterile gloves and mask. Skin site was marked, prepped with
Betadine, and draped in usual sterile fashion, and infiltrated
locally with 1% lidocaine. A 22 gauge spinal needle was advanced
into the thecal sac at L3 from a left parasagittal approach. Clear
colorless CSF returned. 17 ml Omnipaque 180 were administered
intrathecally for lumbar myelography, followed by axial CT scanning
of the lumbar spine. I personally performed the lumbar puncture and
administered the intrathecal contrast. I also personally supervised
acquisition of the myelogram images. Coronal and sagittal
reconstructions were generated from the axial scan.

[Series 3: l spine soft · axial · 0.26mm/px · z∈[-242,-76]mm · 6 of 79 slices shown, 8 images]
[im 12/79  soft-tissue]
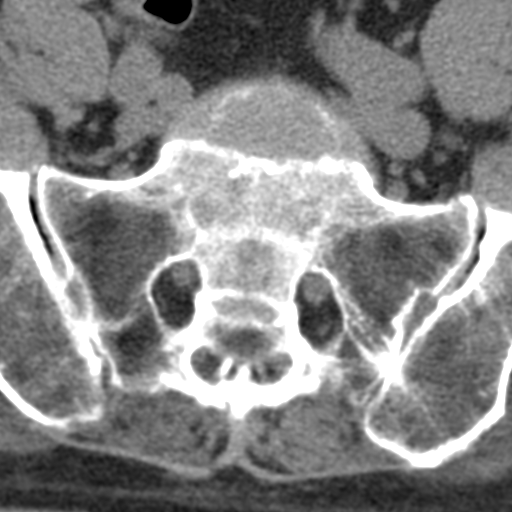
[im 12/79  bone]
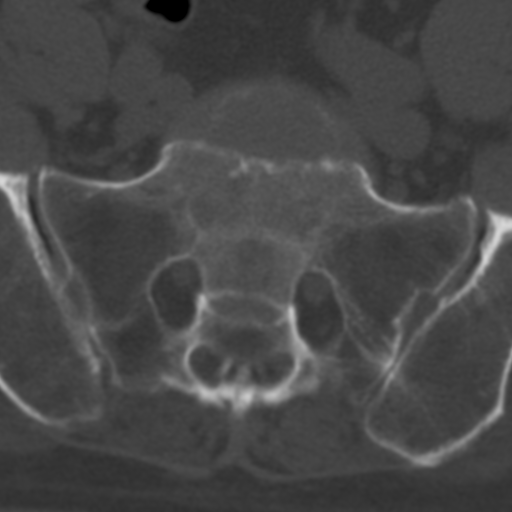
[im 23/79  bone]
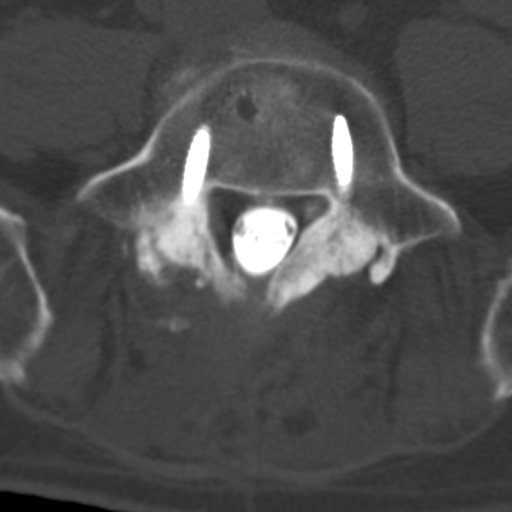
[im 34/79  bone]
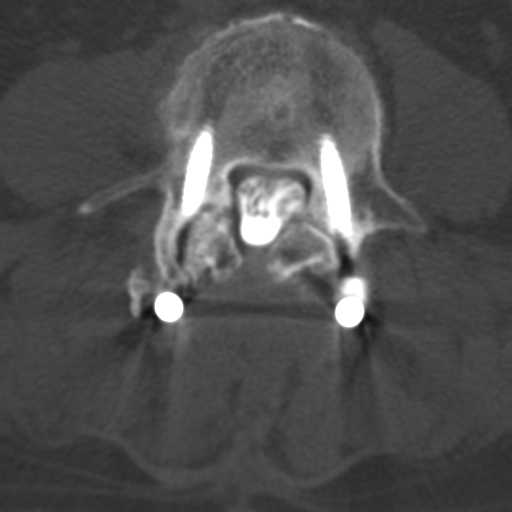
[im 45/79  bone]
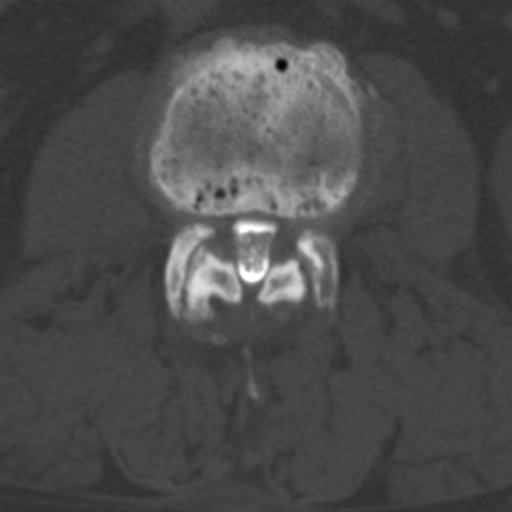
[im 56/79  soft-tissue]
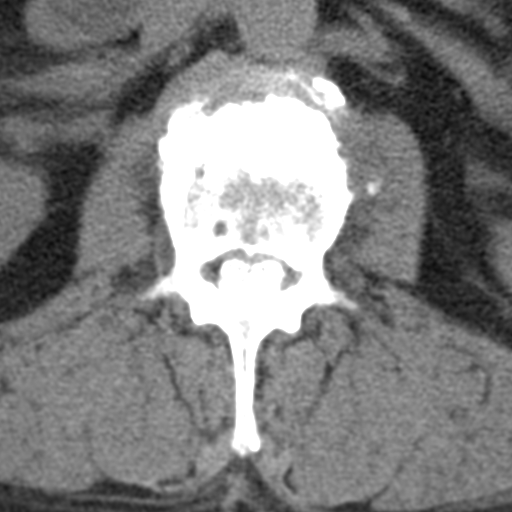
[im 56/79  bone]
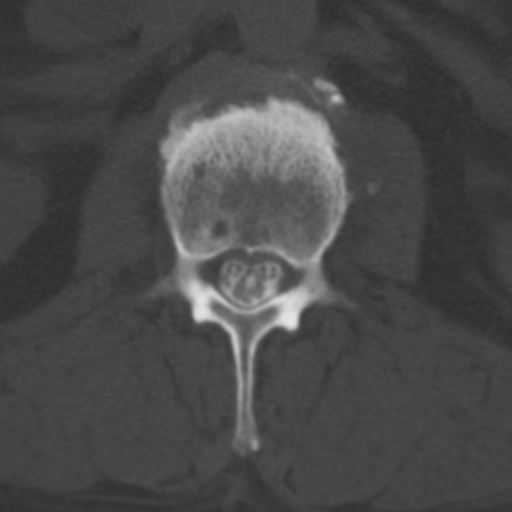
[im 67/79  bone]
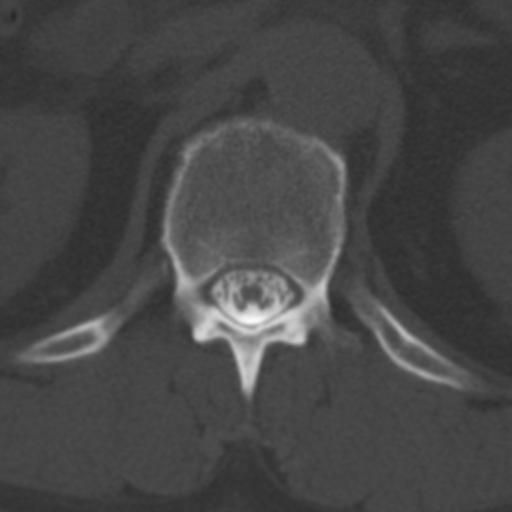

[6 of 14 positions shown; findings below may reference images not displayed]

FINDINGS: Numbering scheme follows that used on previous exam. 5 non
rib-bearing lumbar segments labeled L1-L5.

T11-12: Mild disc bulge. Narrowing of the interspace with central
vacuum phenomenon. Small Schmorl's nodes in the adjacent endplates.
Central canal and foramina patent.

T12-L1: Mild circumferential disc bulge. Conus terminates behind L1.
Central canal and foramina patent. Early facet degenerative change
on the right. Small Schmorl's nodes in the adjacent endplates.

L1-2: Significant narrowing of the interspace with near bone on bone
apposition, new since previous exam. Subchondral sclerosis with
multiple small Schmorl's nodes in the endplates. Moderate
circumferential disc bulge. Bilateral facet DJD with some thickening
of the ligamentum flavum contributing to severe spinal stenosis at
the level of the interspace. Bilateral neural foraminal stenosis.

L2-3: Progressive narrowing of the interspace since previous study
with increasing vacuum phenomenon, near bone on bone apposition,
increase in subchondral sclerosis, and multiple small Schmorl's
nodes. Circumferential disc bulge. Advanced facet DJD and thickening
of ligamentum flavum contributing to moderate spinal stenosis at the
level of the interspace despite previous posterior decompression.
Bilateral neural foraminal encroachment.

L3-4: Moderate narrowing of the interspace with progressive vacuum
phenomenon and small Schmorl's nodes in the endplates. Previous
posterior decompression. No spinal stenosis. Bilateral foraminal
encroachment right greater than left. L4 pedicle screws traverse
bilateral facet joints, with moderate degenerative changes.

L4-5: Changes of instrumented PLIF. Solid-appearing fusion across
posterior elements. Graft and markers in the interspace with
solid-appearing fusion. Posterior decompression. The anterolisthesis
seen on the preop study has been reduced. Foramina patent.

L5-S1: Extension of posterior decompression across this level.
Advanced bilateral facet DJD. No significant disc bulge or
protrusion.

Scattered aortoiliac arterial plaque. Partially calcified stones in
the gallbladder, incompletely visualized. Remainder visualized
paraspinal soft tissues unremarkable.
IMPRESSION: 1. Interval progression of multifactorial spinal stenosis, severe at
L1-2, moderately severe L2-3.
2. Bilateral foraminal encroachment L3-4 right worse than left.
3. Solid-appearing PLIF L4-5 without apparent complication.
4. Cholelithiasis.

## 2017-10-06 DIAGNOSIS — R195 Other fecal abnormalities: Secondary | ICD-10-CM | POA: Diagnosis not present

## 2017-10-06 LAB — CBC
HEMATOCRIT: 37.2 % — AB (ref 38.5–50.0)
Hemoglobin: 13.2 g/dL (ref 13.2–17.1)
MCH: 32.6 pg (ref 27.0–33.0)
MCHC: 35.5 g/dL (ref 32.0–36.0)
MCV: 91.9 fL (ref 80.0–100.0)
MPV: 9.6 fL (ref 7.5–12.5)
PLATELETS: 207 10*3/uL (ref 140–400)
RBC: 4.05 10*6/uL — ABNORMAL LOW (ref 4.20–5.80)
RDW: 11.5 % (ref 11.0–15.0)
WBC: 4.7 10*3/uL (ref 3.8–10.8)

## 2017-10-07 ENCOUNTER — Encounter (INDEPENDENT_AMBULATORY_CARE_PROVIDER_SITE_OTHER): Payer: Self-pay | Admitting: Internal Medicine

## 2017-10-07 NOTE — Progress Notes (Signed)
Patient was given an appointment for 04/06/18 at 2:45pm with Deberah Castle, NP.  A letter was mailed to the patient.

## 2017-12-06 DIAGNOSIS — E1165 Type 2 diabetes mellitus with hyperglycemia: Secondary | ICD-10-CM | POA: Diagnosis not present

## 2017-12-06 DIAGNOSIS — I10 Essential (primary) hypertension: Secondary | ICD-10-CM | POA: Diagnosis not present

## 2017-12-06 DIAGNOSIS — M545 Low back pain: Secondary | ICD-10-CM | POA: Diagnosis not present

## 2017-12-06 DIAGNOSIS — Z299 Encounter for prophylactic measures, unspecified: Secondary | ICD-10-CM | POA: Diagnosis not present

## 2017-12-06 DIAGNOSIS — E1142 Type 2 diabetes mellitus with diabetic polyneuropathy: Secondary | ICD-10-CM | POA: Diagnosis not present

## 2017-12-06 DIAGNOSIS — J449 Chronic obstructive pulmonary disease, unspecified: Secondary | ICD-10-CM | POA: Diagnosis not present

## 2017-12-06 DIAGNOSIS — Z6828 Body mass index (BMI) 28.0-28.9, adult: Secondary | ICD-10-CM | POA: Diagnosis not present

## 2018-02-02 DIAGNOSIS — H524 Presbyopia: Secondary | ICD-10-CM | POA: Diagnosis not present

## 2018-02-02 DIAGNOSIS — H40013 Open angle with borderline findings, low risk, bilateral: Secondary | ICD-10-CM | POA: Diagnosis not present

## 2018-02-27 DIAGNOSIS — Z1339 Encounter for screening examination for other mental health and behavioral disorders: Secondary | ICD-10-CM | POA: Diagnosis not present

## 2018-02-27 DIAGNOSIS — Z125 Encounter for screening for malignant neoplasm of prostate: Secondary | ICD-10-CM | POA: Diagnosis not present

## 2018-02-27 DIAGNOSIS — I10 Essential (primary) hypertension: Secondary | ICD-10-CM | POA: Diagnosis not present

## 2018-02-27 DIAGNOSIS — Z79899 Other long term (current) drug therapy: Secondary | ICD-10-CM | POA: Diagnosis not present

## 2018-02-27 DIAGNOSIS — R5383 Other fatigue: Secondary | ICD-10-CM | POA: Diagnosis not present

## 2018-02-27 DIAGNOSIS — I70213 Atherosclerosis of native arteries of extremities with intermittent claudication, bilateral legs: Secondary | ICD-10-CM | POA: Diagnosis not present

## 2018-02-27 DIAGNOSIS — J449 Chronic obstructive pulmonary disease, unspecified: Secondary | ICD-10-CM | POA: Diagnosis not present

## 2018-02-27 DIAGNOSIS — E1165 Type 2 diabetes mellitus with hyperglycemia: Secondary | ICD-10-CM | POA: Diagnosis not present

## 2018-02-27 DIAGNOSIS — E7800 Pure hypercholesterolemia, unspecified: Secondary | ICD-10-CM | POA: Diagnosis not present

## 2018-02-27 DIAGNOSIS — Z1211 Encounter for screening for malignant neoplasm of colon: Secondary | ICD-10-CM | POA: Diagnosis not present

## 2018-02-27 DIAGNOSIS — Z6838 Body mass index (BMI) 38.0-38.9, adult: Secondary | ICD-10-CM | POA: Diagnosis not present

## 2018-02-27 DIAGNOSIS — E1142 Type 2 diabetes mellitus with diabetic polyneuropathy: Secondary | ICD-10-CM | POA: Diagnosis not present

## 2018-02-27 DIAGNOSIS — Z7189 Other specified counseling: Secondary | ICD-10-CM | POA: Diagnosis not present

## 2018-02-27 DIAGNOSIS — Z299 Encounter for prophylactic measures, unspecified: Secondary | ICD-10-CM | POA: Diagnosis not present

## 2018-02-27 DIAGNOSIS — Z1331 Encounter for screening for depression: Secondary | ICD-10-CM | POA: Diagnosis not present

## 2018-02-27 DIAGNOSIS — Z0000 Encounter for general adult medical examination without abnormal findings: Secondary | ICD-10-CM | POA: Diagnosis not present

## 2018-03-15 DIAGNOSIS — E1142 Type 2 diabetes mellitus with diabetic polyneuropathy: Secondary | ICD-10-CM | POA: Diagnosis not present

## 2018-03-15 DIAGNOSIS — Z299 Encounter for prophylactic measures, unspecified: Secondary | ICD-10-CM | POA: Diagnosis not present

## 2018-03-15 DIAGNOSIS — E1165 Type 2 diabetes mellitus with hyperglycemia: Secondary | ICD-10-CM | POA: Diagnosis not present

## 2018-03-15 DIAGNOSIS — I70213 Atherosclerosis of native arteries of extremities with intermittent claudication, bilateral legs: Secondary | ICD-10-CM | POA: Diagnosis not present

## 2018-03-15 DIAGNOSIS — I10 Essential (primary) hypertension: Secondary | ICD-10-CM | POA: Diagnosis not present

## 2018-03-15 DIAGNOSIS — E7800 Pure hypercholesterolemia, unspecified: Secondary | ICD-10-CM | POA: Diagnosis not present

## 2018-03-15 DIAGNOSIS — Z6828 Body mass index (BMI) 28.0-28.9, adult: Secondary | ICD-10-CM | POA: Diagnosis not present

## 2018-03-15 DIAGNOSIS — J449 Chronic obstructive pulmonary disease, unspecified: Secondary | ICD-10-CM | POA: Diagnosis not present

## 2018-03-16 DIAGNOSIS — H25013 Cortical age-related cataract, bilateral: Secondary | ICD-10-CM | POA: Diagnosis not present

## 2018-03-16 DIAGNOSIS — H268 Other specified cataract: Secondary | ICD-10-CM | POA: Diagnosis not present

## 2018-03-16 DIAGNOSIS — H2513 Age-related nuclear cataract, bilateral: Secondary | ICD-10-CM | POA: Diagnosis not present

## 2018-03-16 DIAGNOSIS — H2512 Age-related nuclear cataract, left eye: Secondary | ICD-10-CM | POA: Diagnosis not present

## 2018-03-16 DIAGNOSIS — H40013 Open angle with borderline findings, low risk, bilateral: Secondary | ICD-10-CM | POA: Diagnosis not present

## 2018-03-20 DIAGNOSIS — I70213 Atherosclerosis of native arteries of extremities with intermittent claudication, bilateral legs: Secondary | ICD-10-CM | POA: Diagnosis not present

## 2018-03-20 DIAGNOSIS — I70211 Atherosclerosis of native arteries of extremities with intermittent claudication, right leg: Secondary | ICD-10-CM | POA: Diagnosis not present

## 2018-03-27 DIAGNOSIS — E1142 Type 2 diabetes mellitus with diabetic polyneuropathy: Secondary | ICD-10-CM | POA: Diagnosis not present

## 2018-03-27 DIAGNOSIS — Z299 Encounter for prophylactic measures, unspecified: Secondary | ICD-10-CM | POA: Diagnosis not present

## 2018-03-27 DIAGNOSIS — I739 Peripheral vascular disease, unspecified: Secondary | ICD-10-CM | POA: Diagnosis not present

## 2018-03-27 DIAGNOSIS — I10 Essential (primary) hypertension: Secondary | ICD-10-CM | POA: Diagnosis not present

## 2018-03-27 DIAGNOSIS — E1165 Type 2 diabetes mellitus with hyperglycemia: Secondary | ICD-10-CM | POA: Diagnosis not present

## 2018-03-27 DIAGNOSIS — I70213 Atherosclerosis of native arteries of extremities with intermittent claudication, bilateral legs: Secondary | ICD-10-CM | POA: Diagnosis not present

## 2018-03-27 DIAGNOSIS — J449 Chronic obstructive pulmonary disease, unspecified: Secondary | ICD-10-CM | POA: Diagnosis not present

## 2018-03-27 DIAGNOSIS — Z6827 Body mass index (BMI) 27.0-27.9, adult: Secondary | ICD-10-CM | POA: Diagnosis not present

## 2018-03-27 DIAGNOSIS — Z789 Other specified health status: Secondary | ICD-10-CM | POA: Diagnosis not present

## 2018-03-28 DIAGNOSIS — H25811 Combined forms of age-related cataract, right eye: Secondary | ICD-10-CM | POA: Diagnosis not present

## 2018-03-28 DIAGNOSIS — H25812 Combined forms of age-related cataract, left eye: Secondary | ICD-10-CM | POA: Diagnosis not present

## 2018-03-28 DIAGNOSIS — H2512 Age-related nuclear cataract, left eye: Secondary | ICD-10-CM | POA: Diagnosis not present

## 2018-04-05 ENCOUNTER — Encounter (INDEPENDENT_AMBULATORY_CARE_PROVIDER_SITE_OTHER): Payer: Self-pay | Admitting: *Deleted

## 2018-04-05 ENCOUNTER — Telehealth (INDEPENDENT_AMBULATORY_CARE_PROVIDER_SITE_OTHER): Payer: Self-pay | Admitting: *Deleted

## 2018-04-05 ENCOUNTER — Encounter (INDEPENDENT_AMBULATORY_CARE_PROVIDER_SITE_OTHER): Payer: Self-pay | Admitting: Internal Medicine

## 2018-04-05 ENCOUNTER — Ambulatory Visit (INDEPENDENT_AMBULATORY_CARE_PROVIDER_SITE_OTHER): Payer: Medicare Other | Admitting: Internal Medicine

## 2018-04-05 VITALS — BP 114/70 | HR 60 | Resp 18 | Ht 71.0 in | Wt 193.9 lb

## 2018-04-05 DIAGNOSIS — R195 Other fecal abnormalities: Secondary | ICD-10-CM | POA: Diagnosis not present

## 2018-04-05 HISTORY — DX: Other fecal abnormalities: R19.5

## 2018-04-05 MED ORDER — PEG 3350-KCL-NA BICARB-NACL 420 G PO SOLR: 4000.0000 mL | Freq: Once | ORAL | 0 refills | Status: AC

## 2018-04-05 NOTE — Telephone Encounter (Signed)
Patient needs trilyte 

## 2018-04-05 NOTE — Progress Notes (Signed)
Subjective:    Patient ID: William [name], male    DOB: [birth year], 77 y.o.   MRN: [id number]  HPI Referred by Dr. Woody Seller for positive stool card.  Last seen in July of 2018.   Stool cards that were sent home last year were all negative. He states he has not seen any blood. No change in his bowel. No melena. No abdominal pain. Some back pain. Appetite is good.  No family hx of colon cancer.  Takes Diclofenac 75mg  BID.   Cataract surgery 1 week ago.    Last colonoscopy in 2014 (screening) and was normal except for small hemorrhoids (external) below dentate ine and single anal papilla.   03/08/2018 H and H 13.3 and 38.9  CBC    Component Value Date/Time   WBC 4.7 10/06/2017 1056   RBC 4.05 (L) 10/06/2017 1056   HGB 13.2 10/06/2017 1056   HGB 12.8 (L) 06/30/2017 1052   HCT 37.2 (L) 10/06/2017 1056   HCT 37.3 (L) 06/30/2017 1052   PLT 207 10/06/2017 1056   PLT 213 06/30/2017 1052   MCV 91.9 10/06/2017 1056   MCV 94 06/30/2017 1052   MCH 32.6 10/06/2017 1056   MCHC 35.5 10/06/2017 1056   RDW 11.5 10/06/2017 1056   RDW 12.7 06/30/2017 1052    Review of Systems Past Medical History:  Diagnosis Date  . Arthritis   . Arthritis 03/24/2017  . Cancer ([place]) 2014   skin ca, basal cell  . Diabetes mellitus    borderline controlled with diet, checks hemaglobin A 1 C every 3-4 months  . Essential hypertension, benign 03/24/2017  . Glaucoma (increased eye pressure)    bilaterally  . Hearing difficulty    "has ringing in left ear"  . High cholesterol 03/24/2017  . Hyperlipemia   . Neuromuscular disorder (HCC)    numbness/tingling lower extremeties    Past Surgical History:  Procedure Laterality Date  . ACETABULAR REVISION Left 06/03/2014   Procedure: LEFT ACETABULAR REVISION;  Surgeon: Mauri Pole, MD;  Location: [facility];  Service: Orthopedics;  Laterality: Left;  . APPENDECTOMY  1960  . BACK SURGERY    also done july 2013   low back surgery right side 1997  . COLONOSCOPY   10/05/2012   Procedure: COLONOSCOPY;  Surgeon: Rogene Houston, MD;  [facility]: [facility];  Service: Endoscopy;  Laterality: N/A;  830  . JOINT REPLACEMENT    . PARTIAL HIP ARTHROPLASTY  2002   x 1  . SKIN CANCER EXCISION    . TONSILLECTOMY  as child  . TOTAL HIP ARTHROPLASTY Left    x 2  . TOTAL HIP ARTHROPLASTY Right 2006    No Known Allergies  Current Outpatient Medications on File Prior to Visit  Medication Sig Dispense Refill  . acetaminophen (TYLENOL) 325 MG tablet Take by mouth as needed. Two in am and two in pm     . aspirin 81 MG chewable tablet Chew by mouth daily.    . cetirizine (ZYRTEC) 10 MG tablet Take 10 mg by mouth daily as needed. For allergies    . diclofenac (VOLTAREN) 75 MG EC tablet Take 75 mg by mouth 2 (two) times daily.    Marland Kitchen gabapentin (NEURONTIN) 300 MG capsule Take 300 mg by mouth 3 (three) times daily.    Marland Kitchen ketorolac (ACULAR) 0.5 % ophthalmic solution Place 1 drop into both eyes 2 (two) times daily.     Marland Kitchen latanoprost (XALATAN) 0.005 % ophthalmic solution  Place 1 drop into both eyes at bedtime.    Marland Kitchen lisinopril (PRINIVIL,ZESTRIL) 5 MG tablet Take 5 mg by mouth daily.    Marland Kitchen Lysine 500 MG CAPS Take 1 capsule by mouth daily.    . Multiple Vitamin (MULTIVITAMIN WITH MINERALS) TABS Take 1 tablet by mouth daily.    . prednisoLONE acetate (PRED FORTE) 1 % ophthalmic suspension Place 1 drop into the left eye 2 (two) times daily.     . Red Yeast Rice 600 MG CAPS Take by mouth 2 (two) times daily.    . traMADol (ULTRAM) 50 MG tablet Take by mouth every 6 (six) hours as needed.     No current facility-administered medications on file prior to visit.         Objective:   Physical Exam Blood pressure 114/70, pulse 60, resp. rate 18, height 5\' 11"  (1.803 m), weight 193 lb 14.4 oz (88 kg). Alert and oriented. Skin warm and dry. Oral mucosa is moist.   . Sclera anicteric, conjunctivae is pink. Thyroid not enlarged. No cervical lymphadenopathy. Lungs clear. Heart  regular rate and rhythm.  Abdomen is soft. Bowel sounds are positive. No hepatomegaly. No abdominal masses felt. No tenderness.  No edema to lower extremities.           Assessment & Plan:  Guaiac positive stool. Denies any melena. Will rule out colonic neoplasm, AVM, hemorrhoid. Last colonoscopy was normal.

## 2018-04-05 NOTE — Patient Instructions (Signed)
The risks of bleeding, perforation and infection were reviewed with patient.  

## 2018-04-06 ENCOUNTER — Ambulatory Visit (INDEPENDENT_AMBULATORY_CARE_PROVIDER_SITE_OTHER): Payer: Medicare Other | Admitting: Internal Medicine

## 2018-04-20 DIAGNOSIS — H2511 Age-related nuclear cataract, right eye: Secondary | ICD-10-CM | POA: Diagnosis not present

## 2018-04-20 DIAGNOSIS — H25011 Cortical age-related cataract, right eye: Secondary | ICD-10-CM | POA: Diagnosis not present

## 2018-04-25 DIAGNOSIS — H2512 Age-related nuclear cataract, left eye: Secondary | ICD-10-CM | POA: Diagnosis not present

## 2018-04-25 DIAGNOSIS — H2511 Age-related nuclear cataract, right eye: Secondary | ICD-10-CM | POA: Diagnosis not present

## 2018-04-25 DIAGNOSIS — H5703 Miosis: Secondary | ICD-10-CM | POA: Diagnosis not present

## 2018-04-25 DIAGNOSIS — H25811 Combined forms of age-related cataract, right eye: Secondary | ICD-10-CM | POA: Diagnosis not present

## 2018-05-31 ENCOUNTER — Encounter (HOSPITAL_COMMUNITY): Payer: Self-pay | Admitting: *Deleted

## 2018-05-31 ENCOUNTER — Ambulatory Visit (HOSPITAL_COMMUNITY)
Admission: RE | Admit: 2018-05-31 | Discharge: 2018-05-31 | Disposition: A | Discharge status: Home / Self Care | Payer: Medicare Other | Source: Ambulatory Visit | Attending: Internal Medicine | Admitting: Internal Medicine

## 2018-05-31 ENCOUNTER — Other Ambulatory Visit: Payer: Self-pay

## 2018-05-31 ENCOUNTER — Encounter (HOSPITAL_COMMUNITY)
Admission: RE | Disposition: A | Discharge status: Home / Self Care | Payer: Self-pay | Source: Ambulatory Visit | Attending: Internal Medicine

## 2018-05-31 DIAGNOSIS — I10 Essential (primary) hypertension: Secondary | ICD-10-CM | POA: Insufficient documentation

## 2018-05-31 DIAGNOSIS — K6289 Other specified diseases of anus and rectum: Secondary | ICD-10-CM | POA: Insufficient documentation

## 2018-05-31 DIAGNOSIS — H409 Unspecified glaucoma: Secondary | ICD-10-CM | POA: Diagnosis not present

## 2018-05-31 DIAGNOSIS — E7800 Pure hypercholesterolemia, unspecified: Secondary | ICD-10-CM | POA: Insufficient documentation

## 2018-05-31 DIAGNOSIS — Z79899 Other long term (current) drug therapy: Secondary | ICD-10-CM | POA: Insufficient documentation

## 2018-05-31 DIAGNOSIS — R195 Other fecal abnormalities: Secondary | ICD-10-CM | POA: Insufficient documentation

## 2018-05-31 DIAGNOSIS — K644 Residual hemorrhoidal skin tags: Secondary | ICD-10-CM | POA: Diagnosis not present

## 2018-05-31 DIAGNOSIS — Z85828 Personal history of other malignant neoplasm of skin: Secondary | ICD-10-CM | POA: Insufficient documentation

## 2018-05-31 DIAGNOSIS — Z7982 Long term (current) use of aspirin: Secondary | ICD-10-CM | POA: Insufficient documentation

## 2018-05-31 DIAGNOSIS — E119 Type 2 diabetes mellitus without complications: Secondary | ICD-10-CM | POA: Diagnosis not present

## 2018-05-31 HISTORY — PX: COLONOSCOPY: SHX5424

## 2018-05-31 SURGERY — COLONOSCOPY
Anesthesia: Moderate Sedation

## 2018-05-31 MED ORDER — MIDAZOLAM HCL 5 MG/5ML IJ SOLN
INTRAMUSCULAR | Status: AC
  Filled 2018-05-31: qty 10

## 2018-05-31 MED ORDER — MIDAZOLAM HCL 5 MG/5ML IJ SOLN
INTRAMUSCULAR | Status: DC | PRN
  Administered 2018-05-31 (×2): 2 mg via INTRAVENOUS

## 2018-05-31 MED ORDER — MEPERIDINE HCL 50 MG/ML IJ SOLN
INTRAMUSCULAR | Status: AC
  Filled 2018-05-31: qty 1

## 2018-05-31 MED ORDER — STERILE WATER FOR IRRIGATION IR SOLN
Status: DC | PRN
  Administered 2018-05-31: 12:00:00

## 2018-05-31 MED ORDER — MEPERIDINE HCL 50 MG/ML IJ SOLN
INTRAMUSCULAR | Status: DC | PRN
  Administered 2018-05-31 (×2): 25 mg via INTRAVENOUS

## 2018-05-31 MED ORDER — SODIUM CHLORIDE 0.9 % IV SOLN
INTRAVENOUS | Status: DC
  Administered 2018-05-31: 1000 mL via INTRAVENOUS

## 2018-05-31 NOTE — Op Note (Signed)
[facility] Patient Name: William [name] Procedure Date: 05/31/2018 11:28 AM MRN: [id number] Date of Birth: 02-Jun-1941 Attending MD: Hildred Laser , MD CSN: [id number] Age: 77 Admit Type: Outpatient Procedure:                Colonoscopy Indications:              Heme positive stool Providers:                Hildred Laser, MD, Otis Peak B. Sharon Seller, RN, Hinton Rao, RN Referring MD:             Glenda Chroman, MD Medicines:                Meperidine 50 mg IV, Midazolam 4 mg IV Complications:            No immediate complications. Estimated Blood Loss:     Estimated blood loss: none. Procedure:                Pre-Anesthesia Assessment:                           - Prior to the procedure, a History and Physical                            was performed, and patient medications and                            allergies were reviewed. The patient's tolerance of                            previous anesthesia was also reviewed. The risks                            and benefits of the procedure and the sedation                            options and risks were discussed with the patient.                            All questions were answered, and informed consent                            was obtained. Prior Anticoagulants: The patient                            last took aspirin 3 days and previous NSAID                            medication 1 day prior to the procedure. ASA Grade                            Assessment: II - A patient with mild systemic  disease. After reviewing the risks and benefits,                            the patient was deemed in satisfactory condition to                            undergo the procedure.                           After obtaining informed consent, the colonoscope                            was passed under direct vision. Throughout the                            procedure, the patient's blood  pressure, pulse, and                            oxygen saturations were monitored continuously. The                            PCF-[id number] ([id number]) scope was introduced through                            the anus and advanced to the the cecum, identified                            by appendiceal orifice and ileocecal valve. The                            colonoscopy was performed without difficulty. The                            patient tolerated the procedure well. The quality                            of the bowel preparation was good. The ileocecal                            valve, appendiceal orifice, and rectum were                            photographed. Scope In: 11:50:08 AM Scope Out: 12:08:50 PM Scope Withdrawal Time: 0 hours 10 minutes 15 seconds  Total Procedure Duration: 0 hours 18 minutes 42 seconds  Findings:      The perianal and digital rectal examinations were normal.      The colon (entire examined portion) appeared normal.      External hemorrhoids were found during retroflexion. The hemorrhoids       were small.      Anal papilla was hypertrophied. Impression:               - The entire examined colon is normal.                           -  External hemorrhoids.                           - Anal papilla.                           - No specimens collected. Moderate Sedation:      Moderate (conscious) sedation was administered by the endoscopy nurse       and supervised by the endoscopist. The following parameters were       monitored: oxygen saturation, heart rate, blood pressure, CO2       capnography and response to care. Total physician intraservice time was       23 minutes. Recommendation:           - Patient has a contact number available for                            emergencies. The signs and symptoms of potential                            delayed complications were discussed with the                            patient. Return to normal activities  tomorrow.                            Written discharge instructions were provided to the                            patient.                           - Resume previous diet today.                           - Continue present medications.                           - No repeat colonoscopy due to age and the absence                            of advanced adenomas. Procedure Code(s):        --- Professional ---                           [phone], Colonoscopy, flexible; diagnostic, including                            collection of specimen(s) by brushing or washing,                            when performed (separate procedure)                           G0500, Moderate sedation services provided by the  same physician or other qualified health care                            professional performing a gastrointestinal                            endoscopic service that sedation supports,                            requiring the presence of an independent trained                            observer to assist in the monitoring of the                            patient's level of consciousness and physiological                            status; initial 15 minutes of intra-service time;                            patient age 33 years or older (additional time may                            be reported with [phone], as appropriate)                           [phone], Moderate sedation services provided by the                            same physician or other qualified health care                            professional performing the diagnostic or                            therapeutic service that the sedation supports,                            requiring the presence of an independent trained                            observer to assist in the monitoring of the                            patient's level of consciousness and physiological                            status;  each additional 15 minutes intraservice                            time (List separately in addition to code for  primary service) Diagnosis Code(s):        --- Professional ---                           K64.4, Residual hemorrhoidal skin tags                           K62.89, Other specified diseases of anus and rectum                           R19.5, Other fecal abnormalities CPT copyright 2017 American [employer] Association. All rights reserved. The codes documented in this report are preliminary and upon coder review may  be revised to meet current compliance requirements. Hildred Laser, MD Hildred Laser, MD 05/31/2018 12:17:01 PM This report has been signed electronically. Number of Addenda: 0

## 2018-05-31 NOTE — H&P (Addendum)
William [name] is an 77 y.o. male.   Chief Complaint: Patient is here for colonoscopy. HPI: Patient is 77 year old Caucasian male who was recently found to have any positive stool and therefore undergoing diagnostic colonoscopy.  He denies melena or rectal bleeding.  He is on NSAID therapy but he has no epigastric pain nausea or vomiting.  He has good appetite and his weight has been stable. Last colonoscopy was in January 2014 and was negative for polyps. Family history is negative for CRC.  Past Medical History:  Diagnosis Date  . Arthritis   . Arthritis 03/24/2017  . Cancer ([place]) 2014   skin ca, basal cell  . Diabetes mellitus    borderline controlled with diet, checks hemaglobin A 1 C every 3-4 months  . Essential hypertension, benign 03/24/2017  . Glaucoma (increased eye pressure)    bilaterally  . Hearing difficulty    "has ringing in left ear"  . High cholesterol 03/24/2017  . Hyperlipemia   . Neuromuscular disorder (HCC)    numbness/tingling lower extremeties    Past Surgical History:  Procedure Laterality Date  . ACETABULAR REVISION Left 06/03/2014   Procedure: LEFT ACETABULAR REVISION;  Surgeon: Mauri Pole, MD;  Location: [facility];  Service: Orthopedics;  Laterality: Left;  . APPENDECTOMY  1960  . BACK SURGERY    also done july 2013   low back surgery right side 1997  . COLONOSCOPY  10/05/2012   Procedure: COLONOSCOPY;  Surgeon: Rogene Houston, MD;  [facility]: [facility];  Service: Endoscopy;  Laterality: N/A;  830  . JOINT REPLACEMENT    . PARTIAL HIP ARTHROPLASTY  2002   x 1  . SKIN CANCER EXCISION    . TONSILLECTOMY  as child  . TOTAL HIP ARTHROPLASTY Left    x 2  . TOTAL HIP ARTHROPLASTY Right 2006    Family History  Problem Relation Age of Onset  . Heart Problems Mother   . Congestive Heart Failure Mother   . Heart attack Father    Social History:  reports that he has never smoked. He has never used smokeless tobacco. He reports that he does not drink  alcohol or use drugs.  Allergies: No Known Allergies  Medications Prior to Admission  Medication Sig Dispense Refill  . aspirin EC 81 MG tablet Take 81 mg by mouth daily.    . cetirizine (ZYRTEC) 10 MG tablet Take 10 mg by mouth daily as needed. For allergies    . diclofenac (VOLTAREN) 75 MG EC tablet Take 75 mg by mouth 2 (two) times daily.    Marland Kitchen gabapentin (NEURONTIN) 300 MG capsule Take 300 mg by mouth 3 (three) times daily.    Marland Kitchen latanoprost (XALATAN) 0.005 % ophthalmic solution Place 1 drop into both eyes at bedtime.    Marland Kitchen lisinopril (PRINIVIL,ZESTRIL) 5 MG tablet Take 5 mg by mouth daily.    Marland Kitchen Lysine 500 MG CAPS Take 1 capsule by mouth daily.    . Multiple Vitamin (MULTIVITAMIN WITH MINERALS) TABS Take 1 tablet by mouth daily.    . Red Yeast Rice 600 MG CAPS Take 1,200 mg by mouth daily.     . traMADol (ULTRAM) 50 MG tablet Take 50 mg by mouth every 6 (six) hours as needed for moderate pain.       No results found for this or any previous visit (from the past 48 hour(s)). No results found.  ROS  Blood pressure (!) 147/92, pulse 85, temperature 97.6 F (36.4  C), temperature source Oral, resp. rate (!) 25, height 5\' 11"  (1.803 m), weight 87.1 kg, SpO2 100 %. Physical Exam  Constitutional: He appears well-developed and well-nourished.  HENT:  Mouth/Throat: Oropharynx is clear and moist.  Eyes: Conjunctivae are normal. No scleral icterus.  Neck: No thyromegaly present.  Cardiovascular: Normal rate and regular rhythm.  Murmur heard. Faint systolic murmur best heard at left sternal border.  Respiratory: Effort normal and breath sounds normal.  GI:  Appendectomy scar at RLQ.  Abdomen is soft and nontender with organomegaly or masses.  Musculoskeletal: He exhibits no edema.  Lymphadenopathy:    He has no cervical adenopathy.  Neurological: He is alert.  Skin: Skin is warm and dry.     Assessment/Plan Heme positive stool. Diagnostic colonoscopy.Hildred Laser,  MD 05/31/2018, 11:38 AM

## 2018-05-31 NOTE — Discharge Instructions (Signed)
Hemorrhoids Hemorrhoids are swollen veins in and around the rectum or anus. Hemorrhoids can cause pain, itching, or bleeding. Most of the time, they do not cause serious problems. They usually get better with diet changes, lifestyle changes, and other home treatments. Follow these instructions at home: Eating and drinking  Eat foods that have fiber, such as whole grains, beans, nuts, fruits, and vegetables. Ask your doctor about taking products that have added fiber (fibersupplements).  Drink enough fluid to keep your pee (urine) clear or pale yellow. For Pain and Swelling  Take a warm-water bath (sitz bath) for 20 minutes to ease pain. Do this 3-4 times a day.  If directed, put ice on the painful area. It may be helpful to use ice between your warm baths. ? Put ice in a plastic bag. ? Place a towel between your skin and the bag. ? Leave the ice on for 20 minutes, 2-3 times a day. General instructions  Take over-the-counter and prescription medicines only as told by your doctor. ? Medicated creams and medicines that are inserted into the anus (suppositories) may be used or applied as told.  Exercise often.  Go to the bathroom when you have the urge to poop (to have a bowel movement). Do not wait.  Avoid pushing too hard (straining) when you poop.  Keep the butt area dry and clean. Use wet toilet paper or moist paper towels.  Do not sit on the toilet for a long time. Contact a doctor if:  You have any of these: ? Pain and swelling that do not get better with treatment or medicine. ? Bleeding that will not stop. ? Trouble pooping or you cannot poop. ? Pain or swelling outside the area of the hemorrhoids. This information is not intended to replace advice given to you by your health care provider. Make sure you discuss any questions you have with your health care provider. Document Released: 06/15/2008 Document Revised: 02/12/2016 Document Reviewed: 05/21/2015 Elsevier  Interactive Patient Education  2018 [employer]. Colonoscopy, Adult, Care After This sheet gives you information about how to care for yourself after your procedure. Your health care provider may also give you more specific instructions. If you have problems or questions, contact your health care provider. What can I expect after the procedure? After the procedure, it is common to have:  A small amount of blood in your stool for 24 hours after the procedure.  Some gas.  Mild abdominal cramping or bloating.  Follow these instructions at home: General instructions   For the first 24 hours after the procedure: ? Do not drive or use machinery. ? Do not sign important documents. ? Do not drink alcohol. ? Do your regular daily activities at a slower pace than normal. ? Eat soft, easy-to-digest foods. ? Rest often.  Take over-the-counter or prescription medicines only as told by your health care provider.  It is up to you to get the results of your procedure. Ask your health care provider, or the department performing the procedure, when your results will be ready. Relieving cramping and bloating  Try walking around when you have cramps or feel bloated.  Apply heat to your abdomen as told by your health care provider. Use a heat source that your health care provider recommends, such as a moist heat pack or a heating pad. ? Place a towel between your skin and the heat source. ? Leave the heat on for 20-30 minutes. ? Remove the heat if your skin turns  bright red. This is especially important if you are unable to feel pain, heat, or cold. You may have a greater risk of getting burned. Eating and drinking  Drink enough fluid to keep your urine clear or pale yellow.  Resume your normal diet as instructed by your health care provider. Avoid heavy or fried foods that are hard to digest.  Avoid drinking alcohol for as long as instructed by your health care provider. Contact a health care  provider if:  You have blood in your stool 2-3 days after the procedure. Get help right away if:  You have more than a small spotting of blood in your stool.  You pass large blood clots in your stool.  Your abdomen is swollen.  You have nausea or vomiting.  You have a fever.  You have increasing abdominal pain that is not relieved with medicine. This information is not intended to replace advice given to you by your health care provider. Make sure you discuss any questions you have with your health care provider. Document Released: 04/20/2004 Document Revised: 05/31/2016 Document Reviewed: 11/18/2015 Elsevier Interactive Patient Education  2018 [place] usual medications as before. On your next visit with Dr. Woody Seller ask or discuss with them if diclofenac dose could be reduced. Resume usual diet. No driving for 24 hours.

## 2018-06-06 ENCOUNTER — Encounter (HOSPITAL_COMMUNITY): Payer: Self-pay | Admitting: Internal Medicine

## 2018-06-20 DIAGNOSIS — Z23 Encounter for immunization: Secondary | ICD-10-CM | POA: Diagnosis not present

## 2018-06-21 DIAGNOSIS — Z6827 Body mass index (BMI) 27.0-27.9, adult: Secondary | ICD-10-CM | POA: Diagnosis not present

## 2018-06-21 DIAGNOSIS — R21 Rash and other nonspecific skin eruption: Secondary | ICD-10-CM | POA: Diagnosis not present

## 2018-06-21 DIAGNOSIS — E1142 Type 2 diabetes mellitus with diabetic polyneuropathy: Secondary | ICD-10-CM | POA: Diagnosis not present

## 2018-06-21 DIAGNOSIS — E1165 Type 2 diabetes mellitus with hyperglycemia: Secondary | ICD-10-CM | POA: Diagnosis not present

## 2018-06-21 DIAGNOSIS — Z79899 Other long term (current) drug therapy: Secondary | ICD-10-CM | POA: Diagnosis not present

## 2018-06-21 DIAGNOSIS — I10 Essential (primary) hypertension: Secondary | ICD-10-CM | POA: Diagnosis not present

## 2018-06-21 DIAGNOSIS — Z299 Encounter for prophylactic measures, unspecified: Secondary | ICD-10-CM | POA: Diagnosis not present

## 2018-06-21 DIAGNOSIS — M47816 Spondylosis without myelopathy or radiculopathy, lumbar region: Secondary | ICD-10-CM | POA: Diagnosis not present

## 2018-06-26 DIAGNOSIS — R010 Benign and innocent cardiac murmurs: Secondary | ICD-10-CM | POA: Diagnosis not present

## 2018-06-28 DIAGNOSIS — H11001 Unspecified pterygium of right eye: Secondary | ICD-10-CM | POA: Diagnosis not present

## 2018-06-28 DIAGNOSIS — H40013 Open angle with borderline findings, low risk, bilateral: Secondary | ICD-10-CM | POA: Diagnosis not present

## 2018-06-28 DIAGNOSIS — H02403 Unspecified ptosis of bilateral eyelids: Secondary | ICD-10-CM | POA: Diagnosis not present

## 2018-06-28 DIAGNOSIS — H268 Other specified cataract: Secondary | ICD-10-CM | POA: Diagnosis not present

## 2018-09-08 DIAGNOSIS — Z96641 Presence of right artificial hip joint: Secondary | ICD-10-CM

## 2018-09-08 DIAGNOSIS — Z96642 Presence of left artificial hip joint: Secondary | ICD-10-CM

## 2018-09-08 DIAGNOSIS — Z471 Aftercare following joint replacement surgery: Secondary | ICD-10-CM | POA: Diagnosis not present

## 2018-09-08 DIAGNOSIS — M7061 Trochanteric bursitis, right hip: Secondary | ICD-10-CM | POA: Diagnosis not present

## 2018-09-08 DIAGNOSIS — Z96643 Presence of artificial hip joint, bilateral: Secondary | ICD-10-CM | POA: Diagnosis not present

## 2018-09-08 DIAGNOSIS — M25551 Pain in right hip: Secondary | ICD-10-CM | POA: Diagnosis not present

## 2018-09-08 HISTORY — DX: Presence of left artificial hip joint: Z96.642

## 2018-09-08 HISTORY — DX: Presence of right artificial hip joint: Z96.641

## 2018-09-27 DIAGNOSIS — Z6827 Body mass index (BMI) 27.0-27.9, adult: Secondary | ICD-10-CM | POA: Diagnosis not present

## 2018-09-27 DIAGNOSIS — E1165 Type 2 diabetes mellitus with hyperglycemia: Secondary | ICD-10-CM | POA: Diagnosis not present

## 2018-09-27 DIAGNOSIS — J449 Chronic obstructive pulmonary disease, unspecified: Secondary | ICD-10-CM | POA: Diagnosis not present

## 2018-09-27 DIAGNOSIS — I70213 Atherosclerosis of native arteries of extremities with intermittent claudication, bilateral legs: Secondary | ICD-10-CM | POA: Diagnosis not present

## 2018-09-27 DIAGNOSIS — Z299 Encounter for prophylactic measures, unspecified: Secondary | ICD-10-CM | POA: Diagnosis not present

## 2018-09-27 DIAGNOSIS — E1142 Type 2 diabetes mellitus with diabetic polyneuropathy: Secondary | ICD-10-CM | POA: Diagnosis not present

## 2018-09-27 DIAGNOSIS — I10 Essential (primary) hypertension: Secondary | ICD-10-CM | POA: Diagnosis not present

## 2018-10-20 DIAGNOSIS — M7061 Trochanteric bursitis, right hip: Secondary | ICD-10-CM

## 2018-10-20 DIAGNOSIS — M25551 Pain in right hip: Secondary | ICD-10-CM | POA: Diagnosis not present

## 2018-10-20 DIAGNOSIS — M48061 Spinal stenosis, lumbar region without neurogenic claudication: Secondary | ICD-10-CM | POA: Insufficient documentation

## 2018-10-20 HISTORY — DX: Trochanteric bursitis, right hip: M70.61

## 2018-10-20 HISTORY — DX: Spinal stenosis, lumbar region without neurogenic claudication: M48.061

## 2018-11-09 DIAGNOSIS — M5136 Other intervertebral disc degeneration, lumbar region: Secondary | ICD-10-CM | POA: Insufficient documentation

## 2018-11-09 DIAGNOSIS — M51369 Other intervertebral disc degeneration, lumbar region without mention of lumbar back pain or lower extremity pain: Secondary | ICD-10-CM

## 2018-11-09 DIAGNOSIS — M961 Postlaminectomy syndrome, not elsewhere classified: Secondary | ICD-10-CM | POA: Diagnosis not present

## 2018-11-09 HISTORY — DX: Other intervertebral disc degeneration, lumbar region without mention of lumbar back pain or lower extremity pain: M51.369

## 2018-11-09 HISTORY — DX: Postlaminectomy syndrome, not elsewhere classified: M96.1

## 2018-12-06 DIAGNOSIS — Z299 Encounter for prophylactic measures, unspecified: Secondary | ICD-10-CM | POA: Diagnosis not present

## 2018-12-06 DIAGNOSIS — Z6827 Body mass index (BMI) 27.0-27.9, adult: Secondary | ICD-10-CM | POA: Diagnosis not present

## 2018-12-06 DIAGNOSIS — J449 Chronic obstructive pulmonary disease, unspecified: Secondary | ICD-10-CM | POA: Diagnosis not present

## 2018-12-06 DIAGNOSIS — M545 Low back pain: Secondary | ICD-10-CM | POA: Diagnosis not present

## 2018-12-06 DIAGNOSIS — I10 Essential (primary) hypertension: Secondary | ICD-10-CM | POA: Diagnosis not present

## 2018-12-07 DIAGNOSIS — M5136 Other intervertebral disc degeneration, lumbar region: Secondary | ICD-10-CM | POA: Diagnosis not present

## 2019-01-18 DIAGNOSIS — I739 Peripheral vascular disease, unspecified: Secondary | ICD-10-CM | POA: Diagnosis not present

## 2019-01-18 DIAGNOSIS — E1165 Type 2 diabetes mellitus with hyperglycemia: Secondary | ICD-10-CM | POA: Diagnosis not present

## 2019-01-18 DIAGNOSIS — M47816 Spondylosis without myelopathy or radiculopathy, lumbar region: Secondary | ICD-10-CM

## 2019-01-18 DIAGNOSIS — I10 Essential (primary) hypertension: Secondary | ICD-10-CM | POA: Diagnosis not present

## 2019-01-18 DIAGNOSIS — J449 Chronic obstructive pulmonary disease, unspecified: Secondary | ICD-10-CM | POA: Diagnosis not present

## 2019-01-18 DIAGNOSIS — Z6827 Body mass index (BMI) 27.0-27.9, adult: Secondary | ICD-10-CM | POA: Diagnosis not present

## 2019-01-18 DIAGNOSIS — Z299 Encounter for prophylactic measures, unspecified: Secondary | ICD-10-CM | POA: Diagnosis not present

## 2019-01-18 DIAGNOSIS — E1142 Type 2 diabetes mellitus with diabetic polyneuropathy: Secondary | ICD-10-CM | POA: Diagnosis not present

## 2019-01-18 DIAGNOSIS — M47896 Other spondylosis, lumbar region: Secondary | ICD-10-CM | POA: Diagnosis not present

## 2019-01-18 DIAGNOSIS — M961 Postlaminectomy syndrome, not elsewhere classified: Secondary | ICD-10-CM | POA: Diagnosis not present

## 2019-01-18 DIAGNOSIS — M5136 Other intervertebral disc degeneration, lumbar region: Secondary | ICD-10-CM | POA: Diagnosis not present

## 2019-01-18 HISTORY — DX: Spondylosis without myelopathy or radiculopathy, lumbar region: M47.816

## 2019-02-01 DIAGNOSIS — M47816 Spondylosis without myelopathy or radiculopathy, lumbar region: Secondary | ICD-10-CM | POA: Diagnosis not present

## 2019-02-01 DIAGNOSIS — M47896 Other spondylosis, lumbar region: Secondary | ICD-10-CM | POA: Diagnosis not present

## 2019-02-15 DIAGNOSIS — G894 Chronic pain syndrome: Secondary | ICD-10-CM | POA: Diagnosis not present

## 2019-02-15 DIAGNOSIS — M5136 Other intervertebral disc degeneration, lumbar region: Secondary | ICD-10-CM | POA: Diagnosis not present

## 2019-02-15 DIAGNOSIS — M545 Low back pain: Secondary | ICD-10-CM | POA: Diagnosis not present

## 2019-03-05 DIAGNOSIS — Z299 Encounter for prophylactic measures, unspecified: Secondary | ICD-10-CM | POA: Diagnosis not present

## 2019-03-05 DIAGNOSIS — Z1331 Encounter for screening for depression: Secondary | ICD-10-CM | POA: Diagnosis not present

## 2019-03-05 DIAGNOSIS — Z125 Encounter for screening for malignant neoplasm of prostate: Secondary | ICD-10-CM | POA: Diagnosis not present

## 2019-03-05 DIAGNOSIS — Z0000 Encounter for general adult medical examination without abnormal findings: Secondary | ICD-10-CM | POA: Diagnosis not present

## 2019-03-05 DIAGNOSIS — Z1339 Encounter for screening examination for other mental health and behavioral disorders: Secondary | ICD-10-CM | POA: Diagnosis not present

## 2019-03-05 DIAGNOSIS — E1142 Type 2 diabetes mellitus with diabetic polyneuropathy: Secondary | ICD-10-CM | POA: Diagnosis not present

## 2019-03-05 DIAGNOSIS — E7800 Pure hypercholesterolemia, unspecified: Secondary | ICD-10-CM | POA: Diagnosis not present

## 2019-03-05 DIAGNOSIS — Z6827 Body mass index (BMI) 27.0-27.9, adult: Secondary | ICD-10-CM | POA: Diagnosis not present

## 2019-03-05 DIAGNOSIS — I10 Essential (primary) hypertension: Secondary | ICD-10-CM | POA: Diagnosis not present

## 2019-03-05 DIAGNOSIS — Z1211 Encounter for screening for malignant neoplasm of colon: Secondary | ICD-10-CM | POA: Diagnosis not present

## 2019-03-05 DIAGNOSIS — Z79899 Other long term (current) drug therapy: Secondary | ICD-10-CM | POA: Diagnosis not present

## 2019-03-05 DIAGNOSIS — R5383 Other fatigue: Secondary | ICD-10-CM | POA: Diagnosis not present

## 2019-03-05 DIAGNOSIS — E1165 Type 2 diabetes mellitus with hyperglycemia: Secondary | ICD-10-CM | POA: Diagnosis not present

## 2019-03-05 DIAGNOSIS — Z7189 Other specified counseling: Secondary | ICD-10-CM | POA: Diagnosis not present

## 2019-05-03 DIAGNOSIS — I10 Essential (primary) hypertension: Secondary | ICD-10-CM | POA: Diagnosis not present

## 2019-05-03 DIAGNOSIS — E1165 Type 2 diabetes mellitus with hyperglycemia: Secondary | ICD-10-CM | POA: Diagnosis not present

## 2019-05-03 DIAGNOSIS — S40029A Contusion of unspecified upper arm, initial encounter: Secondary | ICD-10-CM | POA: Diagnosis not present

## 2019-05-03 DIAGNOSIS — Z299 Encounter for prophylactic measures, unspecified: Secondary | ICD-10-CM | POA: Diagnosis not present

## 2019-05-03 DIAGNOSIS — Z6827 Body mass index (BMI) 27.0-27.9, adult: Secondary | ICD-10-CM | POA: Diagnosis not present

## 2019-05-03 DIAGNOSIS — E1142 Type 2 diabetes mellitus with diabetic polyneuropathy: Secondary | ICD-10-CM | POA: Diagnosis not present

## 2019-05-03 DIAGNOSIS — M545 Low back pain: Secondary | ICD-10-CM | POA: Diagnosis not present

## 2019-05-18 DIAGNOSIS — I10 Essential (primary) hypertension: Secondary | ICD-10-CM | POA: Diagnosis not present

## 2019-06-14 DIAGNOSIS — I10 Essential (primary) hypertension: Secondary | ICD-10-CM | POA: Diagnosis not present

## 2019-06-27 DIAGNOSIS — Z23 Encounter for immunization: Secondary | ICD-10-CM | POA: Diagnosis not present

## 2019-07-09 DIAGNOSIS — I10 Essential (primary) hypertension: Secondary | ICD-10-CM | POA: Diagnosis not present

## 2019-08-10 DIAGNOSIS — Z6827 Body mass index (BMI) 27.0-27.9, adult: Secondary | ICD-10-CM | POA: Diagnosis not present

## 2019-08-10 DIAGNOSIS — I739 Peripheral vascular disease, unspecified: Secondary | ICD-10-CM | POA: Diagnosis not present

## 2019-08-10 DIAGNOSIS — I10 Essential (primary) hypertension: Secondary | ICD-10-CM | POA: Diagnosis not present

## 2019-08-10 DIAGNOSIS — J449 Chronic obstructive pulmonary disease, unspecified: Secondary | ICD-10-CM | POA: Diagnosis not present

## 2019-08-10 DIAGNOSIS — Z299 Encounter for prophylactic measures, unspecified: Secondary | ICD-10-CM | POA: Diagnosis not present

## 2019-08-10 DIAGNOSIS — E1142 Type 2 diabetes mellitus with diabetic polyneuropathy: Secondary | ICD-10-CM | POA: Diagnosis not present

## 2019-08-10 DIAGNOSIS — E119 Type 2 diabetes mellitus without complications: Secondary | ICD-10-CM | POA: Diagnosis not present

## 2019-08-10 DIAGNOSIS — E1165 Type 2 diabetes mellitus with hyperglycemia: Secondary | ICD-10-CM | POA: Diagnosis not present

## 2019-08-14 DIAGNOSIS — H43393 Other vitreous opacities, bilateral: Secondary | ICD-10-CM | POA: Diagnosis not present

## 2019-08-21 DIAGNOSIS — E1140 Type 2 diabetes mellitus with diabetic neuropathy, unspecified: Secondary | ICD-10-CM | POA: Diagnosis not present

## 2019-08-21 DIAGNOSIS — M79605 Pain in left leg: Secondary | ICD-10-CM | POA: Diagnosis not present

## 2019-08-21 DIAGNOSIS — M79604 Pain in right leg: Secondary | ICD-10-CM | POA: Diagnosis not present

## 2019-08-21 DIAGNOSIS — H81393 Other peripheral vertigo, bilateral: Secondary | ICD-10-CM | POA: Diagnosis not present

## 2019-09-19 DIAGNOSIS — I10 Essential (primary) hypertension: Secondary | ICD-10-CM | POA: Diagnosis not present

## 2019-10-18 DIAGNOSIS — I10 Essential (primary) hypertension: Secondary | ICD-10-CM | POA: Diagnosis not present

## 2019-11-15 DIAGNOSIS — Z23 Encounter for immunization: Secondary | ICD-10-CM | POA: Diagnosis not present

## 2019-11-16 DIAGNOSIS — I10 Essential (primary) hypertension: Secondary | ICD-10-CM | POA: Diagnosis not present

## 2019-11-16 DIAGNOSIS — Z6827 Body mass index (BMI) 27.0-27.9, adult: Secondary | ICD-10-CM | POA: Diagnosis not present

## 2019-11-16 DIAGNOSIS — E1142 Type 2 diabetes mellitus with diabetic polyneuropathy: Secondary | ICD-10-CM | POA: Diagnosis not present

## 2019-11-16 DIAGNOSIS — Z299 Encounter for prophylactic measures, unspecified: Secondary | ICD-10-CM | POA: Diagnosis not present

## 2019-11-16 DIAGNOSIS — I70213 Atherosclerosis of native arteries of extremities with intermittent claudication, bilateral legs: Secondary | ICD-10-CM | POA: Diagnosis not present

## 2019-11-16 DIAGNOSIS — J449 Chronic obstructive pulmonary disease, unspecified: Secondary | ICD-10-CM | POA: Diagnosis not present

## 2019-11-16 DIAGNOSIS — E1165 Type 2 diabetes mellitus with hyperglycemia: Secondary | ICD-10-CM | POA: Diagnosis not present

## 2019-11-18 DIAGNOSIS — I10 Essential (primary) hypertension: Secondary | ICD-10-CM | POA: Diagnosis not present

## 2019-12-18 DIAGNOSIS — I10 Essential (primary) hypertension: Secondary | ICD-10-CM | POA: Diagnosis not present

## 2020-01-18 DIAGNOSIS — I10 Essential (primary) hypertension: Secondary | ICD-10-CM | POA: Diagnosis not present

## 2020-02-17 DIAGNOSIS — I10 Essential (primary) hypertension: Secondary | ICD-10-CM | POA: Diagnosis not present

## 2020-03-19 DIAGNOSIS — I10 Essential (primary) hypertension: Secondary | ICD-10-CM | POA: Diagnosis not present

## 2020-04-01 DIAGNOSIS — L723 Sebaceous cyst: Secondary | ICD-10-CM | POA: Diagnosis not present

## 2020-04-01 DIAGNOSIS — I10 Essential (primary) hypertension: Secondary | ICD-10-CM | POA: Diagnosis not present

## 2020-04-01 DIAGNOSIS — Z299 Encounter for prophylactic measures, unspecified: Secondary | ICD-10-CM | POA: Diagnosis not present

## 2020-04-01 DIAGNOSIS — J449 Chronic obstructive pulmonary disease, unspecified: Secondary | ICD-10-CM | POA: Diagnosis not present

## 2020-04-01 DIAGNOSIS — E1165 Type 2 diabetes mellitus with hyperglycemia: Secondary | ICD-10-CM | POA: Diagnosis not present

## 2020-04-01 DIAGNOSIS — E1142 Type 2 diabetes mellitus with diabetic polyneuropathy: Secondary | ICD-10-CM | POA: Diagnosis not present

## 2020-04-07 DIAGNOSIS — I739 Peripheral vascular disease, unspecified: Secondary | ICD-10-CM | POA: Diagnosis not present

## 2020-04-11 DIAGNOSIS — I70213 Atherosclerosis of native arteries of extremities with intermittent claudication, bilateral legs: Secondary | ICD-10-CM | POA: Diagnosis not present

## 2020-04-11 DIAGNOSIS — I10 Essential (primary) hypertension: Secondary | ICD-10-CM | POA: Diagnosis not present

## 2020-04-11 DIAGNOSIS — J449 Chronic obstructive pulmonary disease, unspecified: Secondary | ICD-10-CM | POA: Diagnosis not present

## 2020-04-11 DIAGNOSIS — E1165 Type 2 diabetes mellitus with hyperglycemia: Secondary | ICD-10-CM | POA: Diagnosis not present

## 2020-04-11 DIAGNOSIS — E1142 Type 2 diabetes mellitus with diabetic polyneuropathy: Secondary | ICD-10-CM | POA: Diagnosis not present

## 2020-04-11 DIAGNOSIS — Z299 Encounter for prophylactic measures, unspecified: Secondary | ICD-10-CM | POA: Diagnosis not present

## 2020-05-05 DIAGNOSIS — L720 Epidermal cyst: Secondary | ICD-10-CM | POA: Insufficient documentation

## 2020-05-05 HISTORY — DX: Epidermal cyst: L72.0

## 2020-05-13 DIAGNOSIS — H02102 Unspecified ectropion of right lower eyelid: Secondary | ICD-10-CM | POA: Diagnosis not present

## 2020-05-13 DIAGNOSIS — R5383 Other fatigue: Secondary | ICD-10-CM | POA: Diagnosis not present

## 2020-05-13 DIAGNOSIS — J449 Chronic obstructive pulmonary disease, unspecified: Secondary | ICD-10-CM | POA: Diagnosis not present

## 2020-05-13 DIAGNOSIS — I10 Essential (primary) hypertension: Secondary | ICD-10-CM | POA: Diagnosis not present

## 2020-05-13 DIAGNOSIS — M545 Low back pain: Secondary | ICD-10-CM | POA: Diagnosis not present

## 2020-05-13 DIAGNOSIS — I70213 Atherosclerosis of native arteries of extremities with intermittent claudication, bilateral legs: Secondary | ICD-10-CM | POA: Diagnosis not present

## 2020-05-13 DIAGNOSIS — Z299 Encounter for prophylactic measures, unspecified: Secondary | ICD-10-CM | POA: Diagnosis not present

## 2020-05-21 DIAGNOSIS — H11001 Unspecified pterygium of right eye: Secondary | ICD-10-CM | POA: Diagnosis not present

## 2020-05-21 DIAGNOSIS — H02102 Unspecified ectropion of right lower eyelid: Secondary | ICD-10-CM | POA: Diagnosis not present

## 2020-05-21 DIAGNOSIS — H16423 Pannus (corneal), bilateral: Secondary | ICD-10-CM | POA: Diagnosis not present

## 2020-05-27 DIAGNOSIS — Z01812 Encounter for preprocedural laboratory examination: Secondary | ICD-10-CM | POA: Diagnosis not present

## 2020-05-27 DIAGNOSIS — Z20822 Contact with and (suspected) exposure to covid-19: Secondary | ICD-10-CM | POA: Diagnosis not present

## 2020-05-29 DIAGNOSIS — Z7982 Long term (current) use of aspirin: Secondary | ICD-10-CM | POA: Diagnosis not present

## 2020-05-29 DIAGNOSIS — Z79899 Other long term (current) drug therapy: Secondary | ICD-10-CM | POA: Diagnosis not present

## 2020-05-29 DIAGNOSIS — I10 Essential (primary) hypertension: Secondary | ICD-10-CM | POA: Diagnosis not present

## 2020-05-29 DIAGNOSIS — L723 Sebaceous cyst: Secondary | ICD-10-CM | POA: Diagnosis not present

## 2020-05-29 DIAGNOSIS — L720 Epidermal cyst: Secondary | ICD-10-CM | POA: Diagnosis not present

## 2020-05-29 NOTE — Discharge Summary (Signed)
 Discharge Summary  Admit date: 05/29/2020  Discharge date and time: 05/29/2020  Discharge to:  Home  Discharge Service: General Surgery  Discharge Attending Physician: Ellaree Sergio Bolus, *  Discharge  Diagnoses: Multiple sebaceous cysts scalp & back  Secondary Diagnosis: Active Problems:   * No active hospital problems. * Resolved Problems:   * No resolved hospital problems. *   OR Procedures:   EXCISION, BENIGN LESION INCLUDING MARGINS, EXCEPT SKIN TAG, TRUNK, ARMS, LEGS; EXCISED DIAMETER OVER 4.0 CM EXCISION,BENIGN LESION INCLUDE MARGINS,EXCEPT SKIN TAG,HEAD; EXCISED DIAMETER 2.1 TO 3.0 CM Date 05/29/2020 -------------------   Ancillary Procedures: no procedures  Discharge Day Services:   Subjective  Pain Controlled.  Objective  Patient Vitals for the past 8 hrs:  BP Temp Pulse Resp SpO2  05/29/20 0941 145/72 36.4 C (97.5 F) 64 20 99 %   I/O this shift: In: 950 [I.V.:750; IV Piggyback:200] Out: 5 [Blood:5]  Incision: clean/dry/intact  Hospital Course:  see operative report  Condition at Discharge: Improved Discharge Medications:    Medication List    START taking these medications   . ibuprofen 800 MG tablet; Commonly known as: MOTRIN; Take 1 tablet (800  mg total) by mouth every six (6) hours as needed for pain.   CONTINUE taking these medications   . aspirin  81 MG tablet; Commonly known as: ECOTRIN . cetirizine 10 MG tablet; Commonly known as: ZyrTEC . gabapentin  300 MG capsule; Commonly known as: NEURONTIN  . latanoprost  0.005 % ophthalmic solution; Commonly known as: XALATAN  . lisinopriL 20 MG tablet; Commonly known as: PRINIVIL,ZESTRIL . lysine 500 mg Tab . red yeast rice 600 mg Cap . traMADoL 50 mg tablet; Commonly known as: ULTRAM    Pending Test Results:   Discharge Instructions: Activity:  Activity Instructions    Activity as tolerated     OK to shower (no bath)       Diet: Diet Instructions    Discharge diet (specify)      Discharge Nutrition Therapy: Regular   High fiber + Water  (64 oz daily)         Other Instructions: Other Instructions    Call MD for:  difficulty breathing, headache or visual disturbances     Call MD for:  persistent nausea or vomiting     Call MD for:  redness, tenderness, or signs of infection (pain, swelling, redness, odor or green/yellow discharge around incision site)     Call MD for:  severe uncontrolled pain     Call MD for: Temperature > 38.5 Celsius ( > 101.3 Fahrenheit)     Discharge instructions     Post-Operative Instructions for Skin Cyst Removal  GENERAL CONSIDERATIONS:   It is normal to see some bloody drainage on the bandage the day of surgery.  You may apply an ice pack over the bandage, 30 minutes on then 30 minutes off, for comfort and reduction of swelling. DIET:   Diet as desired unless otherwise instructed by your surgeon. ACTIVITIES:   You may drive when you are no longer taking narcotic pain medication and have recovered from anesthesia.  Follow any specific activity instructions given by your surgeon. WOUND CARE:   Keep the bandage dry for 48 hours and then you may remove the bandage and shower over the incision. If you see tape strips across the incision, leave these in place and shower over              them. You may remove them after two weeks if desired  or allow them to fall off.  It is not necessary to reapply a bandage. However, you may apply one if desired to prevent irritation of sutures or staples.  MEDICATIONS:   Take your narcotic pain medication, if prescribed, as directed. It may be that you only require an over-the-counter pain medication such as Tylenol  or Ibuprofen (Advil or Motrin) as directed.  CALL OUR OFFICE IF:  You develop a fever above 101.69F  You develop spreading redness extending one-half inch or more  from the incision.  You develop increased swelling and pain.  You saturate the bandage after the first dressing change. FOLLOW UP:   Follow up in the office as instructed by your surgeon.  Finally, if you have any questions or concerns regarding your surgery or recuperation please DO NOT HESITATE to call our office -we are here to help in any way we can.       Post-Operative Instructions for Skin Cyst Removal  GENERAL CONSIDERATIONS:   It is normal to see some bloody drainage on the bandage the day of surgery.  You may apply an ice pack over the bandage, 30 minutes on then 30 minutes off, for comfort and reduction of swelling. DIET:   Diet as desired unless otherwise instructed by your surgeon. ACTIVITIES:   You may drive when you are no longer taking narcotic pain medication and have recovered from anesthesia.  Follow any specific activity instructions given by your surgeon. WOUND CARE:   Keep the bandage dry for 48 hours and then you may remove the bandage and shower over the incision. If you see tape strips across the incision, leave these in place and shower over              them. You may remove them after two weeks if desired or allow them to fall off.  It is not necessary to reapply a bandage. However, you may apply one if desired to prevent irritation of sutures or staples.  MEDICATIONS:   Take your narcotic pain medication, if prescribed, as directed. It may be that you only require an over-the-counter pain medication such as Tylenol  or Ibuprofen (Advil or Motrin) as directed.  CALL OUR OFFICE IF:  You develop a fever above 101.69F  You develop spreading redness extending one-half inch or more from the incision.  You develop increased swelling and pain.  You saturate the bandage after the first dressing change. FOLLOW  UP:   Follow up in the office as instructed by your surgeon.  Finally, if you have any questions or concerns regarding your surgery or recuperation please DO NOT HESITATE to call our office -we are here to help in any way we can.   Patient to schedule follow-up appointment with surgeon (specify time frame):     Follow up with Dr Maranda 2 weeks       Follow up with Dr Maranda 2 weeks   Remove dressing in 48 hours       Labs and Other Follow-ups after Discharge: Follow Up instructions and Outpatient Referrals    Call MD for:  difficulty breathing, headache or visual disturbances     Call MD for:  persistent nausea or vomiting     Call MD for:  redness, tenderness, or signs of infection (pain, swelling, redness, odor or green/yellow discharge around incision site)     Call MD for:  severe uncontrolled pain     Call MD for: Temperature > 38.5 Celsius ( > 101.3 Fahrenheit)  Discharge instructions     Post-Operative Instructions for Skin Cyst Removal  GENERAL CONSIDERATIONS:   It is normal to see some bloody drainage on the bandage the day of surgery.  You may apply an ice pack over the bandage, 30 minutes on then 30 minutes off, for comfort and reduction of swelling. DIET:   Diet as desired unless otherwise instructed by your surgeon. ACTIVITIES:   You may drive when you are no longer taking narcotic pain medication and have recovered from anesthesia.  Follow any specific activity instructions given by your surgeon. WOUND CARE:   Keep the bandage dry for 48 hours and then you may remove the bandage and shower over the incision. If you see tape strips across the incision, leave these in place and shower over              them. You may remove them after two weeks if desired or allow them to fall off.  It is not necessary to reapply a bandage. However, you may apply  one if desired to prevent irritation of sutures or staples.  MEDICATIONS:   Take your narcotic pain medication, if prescribed, as directed. It may be that you only require an over-the-counter pain medication such as Tylenol  or Ibuprofen (Advil or Motrin) as directed.  CALL OUR OFFICE IF:  You develop a fever above 101.66F  You develop spreading redness extending one-half inch or more from the incision.  You develop increased swelling and pain.  You saturate the bandage after the first dressing change. FOLLOW UP:   Follow up in the office as instructed by your surgeon.  Finally, if you have any questions or concerns regarding your surgery or recuperation please DO NOT HESITATE to call our office -we are here to help in any way we can.      Future Appointments:

## 2020-06-16 DIAGNOSIS — L720 Epidermal cyst: Secondary | ICD-10-CM | POA: Diagnosis not present

## 2020-06-18 DIAGNOSIS — Z299 Encounter for prophylactic measures, unspecified: Secondary | ICD-10-CM | POA: Diagnosis not present

## 2020-06-18 DIAGNOSIS — Z6826 Body mass index (BMI) 26.0-26.9, adult: Secondary | ICD-10-CM | POA: Diagnosis not present

## 2020-06-18 DIAGNOSIS — E1165 Type 2 diabetes mellitus with hyperglycemia: Secondary | ICD-10-CM | POA: Diagnosis not present

## 2020-06-18 DIAGNOSIS — J449 Chronic obstructive pulmonary disease, unspecified: Secondary | ICD-10-CM | POA: Diagnosis not present

## 2020-06-18 DIAGNOSIS — E1142 Type 2 diabetes mellitus with diabetic polyneuropathy: Secondary | ICD-10-CM | POA: Diagnosis not present

## 2020-06-18 DIAGNOSIS — I10 Essential (primary) hypertension: Secondary | ICD-10-CM | POA: Diagnosis not present

## 2020-06-18 DIAGNOSIS — I70213 Atherosclerosis of native arteries of extremities with intermittent claudication, bilateral legs: Secondary | ICD-10-CM | POA: Diagnosis not present

## 2020-07-09 DIAGNOSIS — L720 Epidermal cyst: Secondary | ICD-10-CM | POA: Diagnosis not present

## 2020-07-28 DIAGNOSIS — W1839XA Other fall on same level, initial encounter: Secondary | ICD-10-CM | POA: Diagnosis not present

## 2020-07-28 DIAGNOSIS — Z9049 Acquired absence of other specified parts of digestive tract: Secondary | ICD-10-CM | POA: Diagnosis not present

## 2020-07-28 DIAGNOSIS — S2232XA Fracture of one rib, left side, initial encounter for closed fracture: Secondary | ICD-10-CM | POA: Diagnosis not present

## 2020-08-06 DIAGNOSIS — Z23 Encounter for immunization: Secondary | ICD-10-CM | POA: Diagnosis not present

## 2020-09-26 DIAGNOSIS — J449 Chronic obstructive pulmonary disease, unspecified: Secondary | ICD-10-CM | POA: Diagnosis not present

## 2020-09-26 DIAGNOSIS — I10 Essential (primary) hypertension: Secondary | ICD-10-CM | POA: Diagnosis not present

## 2020-09-26 DIAGNOSIS — E1165 Type 2 diabetes mellitus with hyperglycemia: Secondary | ICD-10-CM | POA: Diagnosis not present

## 2020-09-26 DIAGNOSIS — I70213 Atherosclerosis of native arteries of extremities with intermittent claudication, bilateral legs: Secondary | ICD-10-CM | POA: Diagnosis not present

## 2020-09-26 DIAGNOSIS — Z299 Encounter for prophylactic measures, unspecified: Secondary | ICD-10-CM | POA: Diagnosis not present

## 2020-09-26 DIAGNOSIS — E1142 Type 2 diabetes mellitus with diabetic polyneuropathy: Secondary | ICD-10-CM | POA: Diagnosis not present

## 2020-12-16 DIAGNOSIS — H40013 Open angle with borderline findings, low risk, bilateral: Secondary | ICD-10-CM | POA: Diagnosis not present

## 2021-01-06 DIAGNOSIS — J449 Chronic obstructive pulmonary disease, unspecified: Secondary | ICD-10-CM | POA: Diagnosis not present

## 2021-01-06 DIAGNOSIS — I739 Peripheral vascular disease, unspecified: Secondary | ICD-10-CM | POA: Diagnosis not present

## 2021-01-06 DIAGNOSIS — Z299 Encounter for prophylactic measures, unspecified: Secondary | ICD-10-CM | POA: Diagnosis not present

## 2021-01-06 DIAGNOSIS — E1165 Type 2 diabetes mellitus with hyperglycemia: Secondary | ICD-10-CM | POA: Diagnosis not present

## 2021-01-06 DIAGNOSIS — I10 Essential (primary) hypertension: Secondary | ICD-10-CM | POA: Diagnosis not present

## 2021-01-21 DIAGNOSIS — D0439 Carcinoma in situ of skin of other parts of face: Secondary | ICD-10-CM | POA: Diagnosis not present

## 2021-01-21 DIAGNOSIS — Z299 Encounter for prophylactic measures, unspecified: Secondary | ICD-10-CM | POA: Diagnosis not present

## 2021-01-21 DIAGNOSIS — D485 Neoplasm of uncertain behavior of skin: Secondary | ICD-10-CM | POA: Diagnosis not present

## 2021-01-21 DIAGNOSIS — I10 Essential (primary) hypertension: Secondary | ICD-10-CM | POA: Diagnosis not present

## 2021-02-27 DIAGNOSIS — Z789 Other specified health status: Secondary | ICD-10-CM | POA: Diagnosis not present

## 2021-02-27 DIAGNOSIS — E1165 Type 2 diabetes mellitus with hyperglycemia: Secondary | ICD-10-CM | POA: Diagnosis not present

## 2021-02-27 DIAGNOSIS — Z299 Encounter for prophylactic measures, unspecified: Secondary | ICD-10-CM | POA: Diagnosis not present

## 2021-02-27 DIAGNOSIS — C44622 Squamous cell carcinoma of skin of right upper limb, including shoulder: Secondary | ICD-10-CM | POA: Diagnosis not present

## 2021-02-27 DIAGNOSIS — I10 Essential (primary) hypertension: Secondary | ICD-10-CM | POA: Diagnosis not present

## 2021-02-27 DIAGNOSIS — C44621 Squamous cell carcinoma of skin of unspecified upper limb, including shoulder: Secondary | ICD-10-CM | POA: Diagnosis not present

## 2021-03-16 DIAGNOSIS — Z789 Other specified health status: Secondary | ICD-10-CM | POA: Diagnosis not present

## 2021-03-16 DIAGNOSIS — E7800 Pure hypercholesterolemia, unspecified: Secondary | ICD-10-CM | POA: Diagnosis not present

## 2021-03-16 DIAGNOSIS — Z6826 Body mass index (BMI) 26.0-26.9, adult: Secondary | ICD-10-CM | POA: Diagnosis not present

## 2021-03-16 DIAGNOSIS — Z0000 Encounter for general adult medical examination without abnormal findings: Secondary | ICD-10-CM | POA: Diagnosis not present

## 2021-03-16 DIAGNOSIS — Z299 Encounter for prophylactic measures, unspecified: Secondary | ICD-10-CM | POA: Diagnosis not present

## 2021-03-16 DIAGNOSIS — Z79899 Other long term (current) drug therapy: Secondary | ICD-10-CM | POA: Diagnosis not present

## 2021-03-16 DIAGNOSIS — R5383 Other fatigue: Secondary | ICD-10-CM | POA: Diagnosis not present

## 2021-03-16 DIAGNOSIS — Z1331 Encounter for screening for depression: Secondary | ICD-10-CM | POA: Diagnosis not present

## 2021-03-16 DIAGNOSIS — Z125 Encounter for screening for malignant neoplasm of prostate: Secondary | ICD-10-CM | POA: Diagnosis not present

## 2021-03-16 DIAGNOSIS — Z1339 Encounter for screening examination for other mental health and behavioral disorders: Secondary | ICD-10-CM | POA: Diagnosis not present

## 2021-03-16 DIAGNOSIS — I10 Essential (primary) hypertension: Secondary | ICD-10-CM | POA: Diagnosis not present

## 2021-03-16 DIAGNOSIS — Z7189 Other specified counseling: Secondary | ICD-10-CM | POA: Diagnosis not present

## 2021-04-08 DIAGNOSIS — E1142 Type 2 diabetes mellitus with diabetic polyneuropathy: Secondary | ICD-10-CM | POA: Diagnosis not present

## 2021-04-08 DIAGNOSIS — R972 Elevated prostate specific antigen [PSA]: Secondary | ICD-10-CM | POA: Diagnosis not present

## 2021-04-08 DIAGNOSIS — I10 Essential (primary) hypertension: Secondary | ICD-10-CM | POA: Diagnosis not present

## 2021-04-08 DIAGNOSIS — E1165 Type 2 diabetes mellitus with hyperglycemia: Secondary | ICD-10-CM | POA: Diagnosis not present

## 2021-04-08 DIAGNOSIS — Z299 Encounter for prophylactic measures, unspecified: Secondary | ICD-10-CM | POA: Diagnosis not present

## 2021-05-04 DIAGNOSIS — H16423 Pannus (corneal), bilateral: Secondary | ICD-10-CM | POA: Diagnosis not present

## 2021-06-15 DIAGNOSIS — Z299 Encounter for prophylactic measures, unspecified: Secondary | ICD-10-CM | POA: Diagnosis not present

## 2021-06-15 DIAGNOSIS — E1165 Type 2 diabetes mellitus with hyperglycemia: Secondary | ICD-10-CM | POA: Diagnosis not present

## 2021-06-15 DIAGNOSIS — M1990 Unspecified osteoarthritis, unspecified site: Secondary | ICD-10-CM | POA: Diagnosis not present

## 2021-06-15 DIAGNOSIS — J449 Chronic obstructive pulmonary disease, unspecified: Secondary | ICD-10-CM | POA: Diagnosis not present

## 2021-06-15 DIAGNOSIS — I10 Essential (primary) hypertension: Secondary | ICD-10-CM | POA: Diagnosis not present

## 2021-07-21 DIAGNOSIS — Z23 Encounter for immunization: Secondary | ICD-10-CM | POA: Diagnosis not present

## 2021-08-18 DIAGNOSIS — H40011 Open angle with borderline findings, low risk, right eye: Secondary | ICD-10-CM | POA: Diagnosis not present

## 2021-09-24 DIAGNOSIS — H04123 Dry eye syndrome of bilateral lacrimal glands: Secondary | ICD-10-CM | POA: Diagnosis not present

## 2021-09-24 DIAGNOSIS — H16213 Exposure keratoconjunctivitis, bilateral: Secondary | ICD-10-CM | POA: Diagnosis not present

## 2021-09-24 DIAGNOSIS — H02115 Cicatricial ectropion of left lower eyelid: Secondary | ICD-10-CM | POA: Diagnosis not present

## 2021-09-24 DIAGNOSIS — H04561 Stenosis of right lacrimal punctum: Secondary | ICD-10-CM | POA: Diagnosis not present

## 2021-09-24 DIAGNOSIS — H409 Unspecified glaucoma: Secondary | ICD-10-CM

## 2021-09-24 DIAGNOSIS — I10 Essential (primary) hypertension: Secondary | ICD-10-CM

## 2021-09-24 DIAGNOSIS — H04521 Eversion of right lacrimal punctum: Secondary | ICD-10-CM | POA: Diagnosis not present

## 2021-09-24 DIAGNOSIS — H04523 Eversion of bilateral lacrimal punctum: Secondary | ICD-10-CM | POA: Diagnosis not present

## 2021-09-24 DIAGNOSIS — H04522 Eversion of left lacrimal punctum: Secondary | ICD-10-CM | POA: Diagnosis not present

## 2021-09-24 DIAGNOSIS — H16212 Exposure keratoconjunctivitis, left eye: Secondary | ICD-10-CM | POA: Diagnosis not present

## 2021-09-24 DIAGNOSIS — H02132 Senile ectropion of right lower eyelid: Secondary | ICD-10-CM | POA: Diagnosis not present

## 2021-09-24 DIAGNOSIS — H02112 Cicatricial ectropion of right lower eyelid: Secondary | ICD-10-CM | POA: Diagnosis not present

## 2021-09-24 DIAGNOSIS — H02135 Senile ectropion of left lower eyelid: Secondary | ICD-10-CM | POA: Diagnosis not present

## 2021-09-24 DIAGNOSIS — H16211 Exposure keratoconjunctivitis, right eye: Secondary | ICD-10-CM | POA: Diagnosis not present

## 2021-09-24 HISTORY — DX: Unspecified glaucoma: H40.9

## 2021-09-24 HISTORY — DX: Essential (primary) hypertension: I10

## 2021-10-12 DIAGNOSIS — E1142 Type 2 diabetes mellitus with diabetic polyneuropathy: Secondary | ICD-10-CM | POA: Diagnosis not present

## 2021-10-12 DIAGNOSIS — M25552 Pain in left hip: Secondary | ICD-10-CM | POA: Diagnosis not present

## 2021-10-12 DIAGNOSIS — J449 Chronic obstructive pulmonary disease, unspecified: Secondary | ICD-10-CM | POA: Diagnosis not present

## 2021-10-12 DIAGNOSIS — Z299 Encounter for prophylactic measures, unspecified: Secondary | ICD-10-CM | POA: Diagnosis not present

## 2021-10-12 DIAGNOSIS — I10 Essential (primary) hypertension: Secondary | ICD-10-CM | POA: Diagnosis not present

## 2021-10-12 DIAGNOSIS — E1165 Type 2 diabetes mellitus with hyperglycemia: Secondary | ICD-10-CM | POA: Diagnosis not present

## 2021-10-21 DIAGNOSIS — H04522 Eversion of left lacrimal punctum: Secondary | ICD-10-CM | POA: Diagnosis not present

## 2021-10-21 DIAGNOSIS — H04551 Acquired stenosis of right nasolacrimal duct: Secondary | ICD-10-CM | POA: Diagnosis not present

## 2021-10-21 DIAGNOSIS — H02115 Cicatricial ectropion of left lower eyelid: Secondary | ICD-10-CM | POA: Diagnosis not present

## 2021-10-21 DIAGNOSIS — H04523 Eversion of bilateral lacrimal punctum: Secondary | ICD-10-CM | POA: Diagnosis not present

## 2021-10-21 DIAGNOSIS — H16211 Exposure keratoconjunctivitis, right eye: Secondary | ICD-10-CM | POA: Diagnosis not present

## 2021-10-21 DIAGNOSIS — H04561 Stenosis of right lacrimal punctum: Secondary | ICD-10-CM | POA: Diagnosis not present

## 2021-10-21 DIAGNOSIS — H16213 Exposure keratoconjunctivitis, bilateral: Secondary | ICD-10-CM | POA: Diagnosis not present

## 2021-10-21 DIAGNOSIS — H02132 Senile ectropion of right lower eyelid: Secondary | ICD-10-CM | POA: Diagnosis not present

## 2021-10-21 DIAGNOSIS — H16212 Exposure keratoconjunctivitis, left eye: Secondary | ICD-10-CM | POA: Diagnosis not present

## 2021-10-21 DIAGNOSIS — H04521 Eversion of right lacrimal punctum: Secondary | ICD-10-CM | POA: Diagnosis not present

## 2021-10-21 DIAGNOSIS — H02135 Senile ectropion of left lower eyelid: Secondary | ICD-10-CM | POA: Diagnosis not present

## 2021-10-21 DIAGNOSIS — H02112 Cicatricial ectropion of right lower eyelid: Secondary | ICD-10-CM | POA: Diagnosis not present

## 2022-01-19 DIAGNOSIS — E1165 Type 2 diabetes mellitus with hyperglycemia: Secondary | ICD-10-CM | POA: Diagnosis not present

## 2022-01-19 DIAGNOSIS — Z299 Encounter for prophylactic measures, unspecified: Secondary | ICD-10-CM | POA: Diagnosis not present

## 2022-01-19 DIAGNOSIS — Z789 Other specified health status: Secondary | ICD-10-CM | POA: Diagnosis not present

## 2022-01-19 DIAGNOSIS — I10 Essential (primary) hypertension: Secondary | ICD-10-CM | POA: Diagnosis not present

## 2022-01-19 DIAGNOSIS — M1990 Unspecified osteoarthritis, unspecified site: Secondary | ICD-10-CM | POA: Diagnosis not present

## 2022-02-24 DIAGNOSIS — E7800 Pure hypercholesterolemia, unspecified: Secondary | ICD-10-CM | POA: Diagnosis not present

## 2022-02-24 DIAGNOSIS — I10 Essential (primary) hypertension: Secondary | ICD-10-CM | POA: Diagnosis not present

## 2022-02-24 DIAGNOSIS — R5383 Other fatigue: Secondary | ICD-10-CM | POA: Diagnosis not present

## 2022-02-24 DIAGNOSIS — Z0000 Encounter for general adult medical examination without abnormal findings: Secondary | ICD-10-CM | POA: Diagnosis not present

## 2022-02-24 DIAGNOSIS — Z7189 Other specified counseling: Secondary | ICD-10-CM | POA: Diagnosis not present

## 2022-02-24 DIAGNOSIS — Z79899 Other long term (current) drug therapy: Secondary | ICD-10-CM | POA: Diagnosis not present

## 2022-02-24 DIAGNOSIS — Z1339 Encounter for screening examination for other mental health and behavioral disorders: Secondary | ICD-10-CM | POA: Diagnosis not present

## 2022-02-24 DIAGNOSIS — Z125 Encounter for screening for malignant neoplasm of prostate: Secondary | ICD-10-CM | POA: Diagnosis not present

## 2022-02-24 DIAGNOSIS — Z6826 Body mass index (BMI) 26.0-26.9, adult: Secondary | ICD-10-CM | POA: Diagnosis not present

## 2022-02-24 DIAGNOSIS — Z299 Encounter for prophylactic measures, unspecified: Secondary | ICD-10-CM | POA: Diagnosis not present

## 2022-02-24 DIAGNOSIS — Z1331 Encounter for screening for depression: Secondary | ICD-10-CM | POA: Diagnosis not present

## 2022-03-19 DIAGNOSIS — I10 Essential (primary) hypertension: Secondary | ICD-10-CM | POA: Diagnosis not present

## 2022-03-19 DIAGNOSIS — Z299 Encounter for prophylactic measures, unspecified: Secondary | ICD-10-CM | POA: Diagnosis not present

## 2022-03-19 DIAGNOSIS — E1165 Type 2 diabetes mellitus with hyperglycemia: Secondary | ICD-10-CM | POA: Diagnosis not present

## 2022-03-19 DIAGNOSIS — I739 Peripheral vascular disease, unspecified: Secondary | ICD-10-CM | POA: Diagnosis not present

## 2022-03-19 DIAGNOSIS — J449 Chronic obstructive pulmonary disease, unspecified: Secondary | ICD-10-CM | POA: Diagnosis not present

## 2022-05-26 DIAGNOSIS — Z2821 Immunization not carried out because of patient refusal: Secondary | ICD-10-CM | POA: Diagnosis not present

## 2022-05-26 DIAGNOSIS — B356 Tinea cruris: Secondary | ICD-10-CM | POA: Diagnosis not present

## 2022-05-26 DIAGNOSIS — Z299 Encounter for prophylactic measures, unspecified: Secondary | ICD-10-CM | POA: Diagnosis not present

## 2022-05-26 DIAGNOSIS — Z789 Other specified health status: Secondary | ICD-10-CM | POA: Diagnosis not present

## 2022-05-26 DIAGNOSIS — I10 Essential (primary) hypertension: Secondary | ICD-10-CM | POA: Diagnosis not present

## 2022-05-26 DIAGNOSIS — E1165 Type 2 diabetes mellitus with hyperglycemia: Secondary | ICD-10-CM | POA: Diagnosis not present

## 2022-07-14 DIAGNOSIS — Z23 Encounter for immunization: Secondary | ICD-10-CM | POA: Diagnosis not present

## 2022-09-08 ENCOUNTER — Encounter (INDEPENDENT_AMBULATORY_CARE_PROVIDER_SITE_OTHER): Payer: Self-pay | Admitting: *Deleted

## 2022-10-26 DIAGNOSIS — Z8669 Personal history of other diseases of the nervous system and sense organs: Secondary | ICD-10-CM | POA: Insufficient documentation

## 2022-10-26 DIAGNOSIS — E1142 Type 2 diabetes mellitus with diabetic polyneuropathy: Secondary | ICD-10-CM

## 2022-10-26 DIAGNOSIS — I739 Peripheral vascular disease, unspecified: Secondary | ICD-10-CM

## 2022-10-26 DIAGNOSIS — Z794 Long term (current) use of insulin: Secondary | ICD-10-CM | POA: Insufficient documentation

## 2022-10-26 DIAGNOSIS — J449 Chronic obstructive pulmonary disease, unspecified: Secondary | ICD-10-CM

## 2022-10-26 DIAGNOSIS — G629 Polyneuropathy, unspecified: Secondary | ICD-10-CM | POA: Insufficient documentation

## 2022-10-26 DIAGNOSIS — E11621 Type 2 diabetes mellitus with foot ulcer: Secondary | ICD-10-CM

## 2022-10-26 DIAGNOSIS — C44621 Squamous cell carcinoma of skin of unspecified upper limb, including shoulder: Secondary | ICD-10-CM | POA: Insufficient documentation

## 2022-10-26 DIAGNOSIS — L97511 Non-pressure chronic ulcer of other part of right foot limited to breakdown of skin: Secondary | ICD-10-CM

## 2022-10-26 HISTORY — DX: Chronic obstructive pulmonary disease, unspecified: J44.9

## 2022-10-26 HISTORY — DX: Type 2 diabetes mellitus with diabetic polyneuropathy: E11.42

## 2022-10-26 HISTORY — DX: Squamous cell carcinoma of skin of unspecified upper limb, including shoulder: C44.621

## 2022-10-26 HISTORY — DX: Peripheral vascular disease, unspecified: I73.9

## 2022-10-26 HISTORY — DX: Personal history of other diseases of the nervous system and sense organs: Z86.69

## 2022-10-26 HISTORY — DX: Polyneuropathy, unspecified: G62.9

## 2022-10-26 HISTORY — DX: Type 2 diabetes mellitus with foot ulcer: L97.511

## 2022-11-30 ENCOUNTER — Encounter: Payer: Self-pay | Admitting: Vascular Surgery

## 2022-12-01 ENCOUNTER — Encounter: Payer: Self-pay | Admitting: Vascular Surgery

## 2022-12-01 ENCOUNTER — Ambulatory Visit (INDEPENDENT_AMBULATORY_CARE_PROVIDER_SITE_OTHER): Payer: Medicare Other | Admitting: Vascular Surgery

## 2022-12-01 VITALS — BP 166/52 | HR 62 | Temp 97.5°F | Ht 71.0 in | Wt 184.8 lb

## 2022-12-01 DIAGNOSIS — I739 Peripheral vascular disease, unspecified: Secondary | ICD-10-CM | POA: Diagnosis not present

## 2022-12-01 NOTE — Progress Notes (Signed)
[facility] [place]  Patient name: William [name] MRN: [id number] DOB: 22-Oct-1940 Sex: male  REASON FOR CONSULT: Evaluation arterial insufficiency and venous hypertension bilateral lower extremities  HPI: William [name] is a 82 y.o. male, who is here today for evaluation.  He is here today with his wife.  He has a long history of venous hypertension.  Apparently was treated at the outpatient center and was in [facility] for several years with multiple treatments to both lower extremities regarding venous pathology.  He is not sure what was done.  Did not recall any discussion of laser ablation of the saphenous vein.  Only requires that he had multiple treatments bilaterally.  He specifically denies any prior history of DVT.  He has no claudication type symptoms.  He does report that he walks with a cane or walker due to some gait instability.  He did have some blistering on the toes of both feet and I punctured these himself.  He has had slow healing related to this.  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 Earleen [place] 2 diabetic foot ulcers left and right foot Abnormal ABIs refer to vein and vascular specialist Debridement today dressing changes with Opticell AG 4 x 4's Kerlix Ace change Monday Wednesday Friday Obtain 3 view x-ray bilateral feet rule out osteomyelitis Maintain blood sugar  (less than 180 mg/dL) [place]   Epidermoid cyst 05/05/2020   Last Assessment & Plan: Formatting of this note might be different from the original. 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   Hyperlipemia    Hypertensive disorder 09/24/2021   Lumbar post-laminectomy syndrome 11/09/2018   Lumbar spondylosis 01/18/2019   Neuromuscular disorder (HCC)    numbness/tingling lower extremeties   Neuropathy 10/26/2022   Overweight (BMI 25.0-29.9) 06/05/2014   PAD (peripheral artery disease) ([place]) 10/26/2022   S/P left TH revision 06/03/2014   Spinal stenosis of lumbar region 10/20/2018   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   Trochanteric bursitis of right hip 10/20/2018   Type 2 diabetes mellitus with diabetic polyneuropathy, with long-term current use of insulin ([place]) 10/26/2022    Family History  Problem Relation Age of Onset   Heart Problems Mother    Congestive Heart  Failure Mother    Heart attack Father     SOCIAL HISTORY: Social History   Socioeconomic History   Marital status: Married    Spouse name: Not on file   Number of children: Not on file   Years of education: Not on file   Highest education level: Not on file  Occupational History   Not on file  Tobacco Use   Smoking status: Never   Smokeless tobacco: Never  Vaping Use   Vaping  Use: Never used  Substance and Sexual Activity   Alcohol use: No   Drug use: No   Sexual activity: Not on file  Other Topics Concern   Not on file  Social History Narrative   Not on file   Social Determinants of Health   Financial Resource Strain: Not on file  Food Insecurity: Not on file  Transportation Needs: Not on file  Physical Activity: Not on file  Stress: Not on file  Social Connections: Not on file  Intimate Partner Violence: Not on file    No Known Allergies  Current Outpatient Medications  Medication Sig Dispense Refill   Ascorbic Acid 1000 MG TBCR Take 1 tablet by mouth 2 (two) times daily.     aspirin EC 81 MG tablet Take 81 mg by mouth daily.     cetirizine (ZYRTEC) 10 MG tablet Take 10 mg by mouth daily as needed. For allergies     cyanocobalamin 100 MCG tablet Take 1 tablet by mouth daily.     diclofenac (VOLTAREN) 75 MG EC tablet Take 75 mg by mouth 2 (two) times daily.     gabapentin (NEURONTIN) 300 MG capsule Take 300 mg by mouth 3 (three) times daily.     lisinopril (ZESTRIL) 20 MG tablet Take 20 mg by mouth daily.     LUMIGAN 0.01 % SOLN Place 1 drop into both eyes at bedtime.     Lysine 500 MG CAPS Take 1 capsule by mouth daily.     Multiple Vitamin (MULTIVITAMIN WITH MINERALS) TABS Take 1 tablet by mouth daily.     Red Yeast Rice 600 MG CAPS Take 1,200 mg by mouth daily.      traMADol (ULTRAM) 50 MG tablet Take 50 mg by mouth every 6 (six) hours as needed for moderate pain.      Zinc Acetate 50 MG CAPS Take 1 capsule by mouth daily.     No current facility-administered medications for this visit.    REVIEW OF SYSTEMS:  '[X]'$  denotes positive finding, '[ ]'$  denotes negative finding Cardiac  Comments:  Chest pain or chest pressure:    Shortness of breath upon exertion:    Short of breath when lying flat:    Irregular heart rhythm:        Vascular    Pain in calf, thigh, or hip brought on by ambulation:    Pain in feet at night that wakes you up  from your sleep:  x   Blood clot in your veins:    Leg swelling:         Pulmonary    Oxygen at home:    Productive cough:     Wheezing:         Neurologic    Sudden weakness in arms or legs:  x   Sudden numbness in arms or legs:  x   Sudden onset of difficulty speaking or slurred speech:    Temporary loss of vision in one eye:  Problems with dizziness:         Gastrointestinal    Blood in stool:     Vomited blood:         Genitourinary    Burning when urinating:     Blood in urine:        Psychiatric    Major depression:         Hematologic    Bleeding problems:    Problems with blood clotting too easily:        Skin    Rashes or ulcers:        Constitutional    Fever or chills:      PHYSICAL EXAM: Vitals:   12/01/22 0939  BP: (!) 166/52  Pulse: 62  Temp: (!) 97.5 F (36.4 C)  SpO2: 98%  Weight: 184 lb 12.8 oz (83.8 kg)  Height: '5\' 11"'$  (1.803 m)    GENERAL: The patient is a well-nourished male, in no acute distress. The vital signs are documented above. CARDIOVASCULAR: Plus radial pulses bilaterally.  2+ femoral and 2-3+ popliteal pulses bilaterally.  I do not palpate pedal pulses.  Does have tensive changes of chronic venous hypertension bilaterally.  He has marked hemosiderin deposits most particularly in his medial ankles and distal calves bilaterally.  Dependent rubor to related to venous congestion bilaterally.  No large varicosities. PULMONARY: There is good air exchange  MUSCULOSKELETAL: There are no major deformities or cyanosis. NEUROLOGIC: No focal weakness or paresthesias are detected. SKIN: He does have superficial ulceration due to prior blistering in his right lateral second toe and left medial fourth toe.  No evidence of invasive infection PSYCHIATRIC: The patient has a normal affect.  DATA:  Noninvasive studies from [facility] from 11/16/22 reviewed.  This reveals ankle arm index of 0.74 on the right and 0.76 on the left.  Venous  study revealed no evidence of DVT  I did image his lower extremities with SonoSite ultrasound in our office today.  His saphenous veins are either very small are have been treated with ablation.  There is no evidence of superficial venous hypertension  MEDICAL ISSUES: Had long discussion with the patient and his wife present.  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  He has completely normal popliteal pulses bilaterally.  I do feel that he has adequate arterial flow for healing.  He will continue his follow-up with Dr. Ladona Horns in the [facility] wound center.  I explained that his blistering is related to the venous hypertension.  He had been relatively compliant with knee-high compression stockings.  I explained the critical importance of daily use of these.  Also explained that this should be 20 to 30 mmHg knee-high once his wounds are healed.  He is doing a good job of wearing Ace wrap for compression until he has healing and then will transition to compression stockings.  If he has continued nonhealing or progression, I explained the neck step would be arteriography to determine ability to improve his tibial flow.  I do not feel that this will be necessary but we are available should he have progression.  He will see [place] again on an as-needed basis   Rosetta Posner, MD [facility] Tel [phone] Pager [phone]  Note: Portions of this report may have been transcribed using voice recognition software.  Every effort has been made to ensure accuracy; however, inadvertent computerized transcription  errors may still be present.

## 2022-12-10 DIAGNOSIS — I890 Lymphedema, not elsewhere classified: Secondary | ICD-10-CM | POA: Insufficient documentation

## 2022-12-29 NOTE — Progress Notes (Signed)
 [facility] OCCUPATIONAL THERAPY [facility] 12/29/2022 Note Type: Treatment Note    Patient Name: William [name] Date of Birth:09-Jun-1941 Diagnosis:  Encounter Diagnosis  Name Primary?  . Lymphedema of both lower extremities Yes   Visit: 2  Referring MD:  Maranda Ellaree Bound*   Date of Onset of Impairment- 09/20/13 Date OT Care Plan Established or Reviewed-12/22/22 Date OT Treatment Started-12/22/22 Plan of Care Effective Date: 12/22/2022 - 01/21/2023  Assessment/Plan:   Assessment Assessment details:    Pt tolerated treatment well with no aversion. He demonstrated  moderate density in BLE. This was noted to soften with MLD. Pt demonstrated no s/s of infection on this date. Skin was noted to be stained in color at medial calves demonstrate trace dryness. No weeping noted. He would benefit from continued outpatient occupational therapy to address all goals for optimal care of BLE.   Pt is an 82 year old male who was referred by his wound healing physician with a medical dx of BLE lymphedema. Pt currently presents with stage II secondary lymphedema in bilateral lower extremities with unknown origin. He was being seen at the wound healing center due to wounds on his toes. Pt will benefit from outpatient OT to address lymphedema in bilateral lower extremities. Pt demonstrates body system impairments including edema and decreased skin integrity in bilateral LE that have the potential to impact his ADL, IADL functions, ambulation, as well as general health. He would benefit from outpatient occupational therapy to address the above-mentioned impairments to return to PLOF with independent management of lymphedema. Pt would also benefit from a pneumatic pump in order to become independent with home management of lymphedema.        Impairments: fall risk, decreased mobility, increased edema and decreased skin integrity    Personal Factors/Comorbidities: 1-2    Examination of  Body Systems: activity/participation, integumentary and lymphatic   Clinical Presentation: evolving   Clinical Decision Making: moderate   Prognosis: good prognosis   Positive Prognosis Rationale: motivated for treatment and severity of symptoms. Negative Prognosis Rationale: balance.   Therapy Goals     Goals:     Short Term Goals (2 weeks):  1. Pt's circumference of BLE will be reduced by .5cm at all measurements within 2  weeks.  2. Pt's skin integrity of BLE will improve in 2 weeks to decrease potential for infection or additional wounds.  3. Pt/family will be educated on self MLD and recall it with 100% accuracy to perform in the home in 2 weeks.  4. Pt's skin texture of BLE will improve to a soft, supple state within 2 Weeks.   Long Term Goals (4 weeks):  1. Pt's circumference of BLE will be reduced by 1cm at all measurements within 4 weeks to increase ADL/IADL function.  2. Pt will recall all precautions to prevent lymphedema exacerbation at 100% in 4 weeks. 3. Pt will recall home management plan in 4 weeks.    Plan   Therapy options: will be seen for skilled occupational therapy services   Planned therapy interventions: Manual Lymph Drainage, Manual Therapy, Taping, Therapeutic Activities, Therapeutic Exercises, Home Exercise Program, Garment Measurement and Education - Patient   DME Equipment: compression garments, lymphedema bandages and compression pump.   Frequency: 1-2x week.   Duration in weeks: 4 weeks   Education provided to: patient.   Education provided: HEP, Lymphedema precautions, Importance of Therapy, Manual lymph drainage, Symptom management and Treatment options and plan   Education results: verbalized good understanding.  Communication/Consultation: Medicare Cert/POC sent to Referring Provider.    Total Session Time: 55   Treatment rendered today:     Pt lay in supine with HOB elevated for for comfort. Manual lymph drainage pre-treatment was completed to  both left and right axillary lymph nodes. Lateral sides of left and right trunk manual lymph drainage complete. RLE and then LLE were then treated with manual lymph drainage. Compression reapplied to pt's BLE.     Subjective:  History of Present Condition   History of Present Condition/Chief Complaint:      Pt is an 82 year old male who reports that the swelling in BLE has been going on for awhile. He reports that he had a vascular procedure 2010/2012 in [place]. He reports that he has vascualr insufficucy and that there is a familial history of vascular insuffiency. Pt started seeing [facility] wound healing center starting 10/26/22 to address wounds on B toes. These are currently healing. Pt is currently wearing compression stockings 20-30 mmHg. Pt was referred from wound healing center to address lymphedema in BLE.   PMH: AR, HTN, PAD, Neuropathy, DMII, h/o Meniere's dx, PAD, squamous cell cancer of skin (hand), lymphedema BLE, hx of back sx (x2), hip sx (x4)  Date of Onset:  09/20/2013 Subjective:    Pt reports that his legs do not hurt him on this date. He reports that he is moving slow on this date and is excited to get started.  Quality of life: good  Pain Current pain rating: 0 At best pain rating: 0 At worst pain rating: 0 Quality: heavy Pain Related Behaviors: none Progression: no change   Precautions and Equipment Precautions: Fall risk Current Braces/Orthoses: None Equipment Currently Used: Quad cane: large base Current functional status: unsteady gait, limited walking tolerance and use of assistive device Social Support Lives in: [place] house Lives with: spouse Hand dominance: right Communication Preference: verbal, written and visual Barriers to Learning: No Barriers Work/School: Retired  Diagnostic Tests  Diagnostic Test Comments:      Venous Study (-)  Treatments No previous or current treatments   Patient Goals Patient goals for therapy: other and  decreased edema Patient goal: obtain pneumatic pump, improve skin integrity   Objective:  Lymphedema:  Treatment:    Lymph node removal: No     Chemotherapy: No     Radiation therapy: No     Previous lymphedema treatment: No     Reoccurrence?: No   Current Management:    Daytime:  Knee high stockings and wears garment all day (elevates BLE)   Nighttime:  Nothing Contraindications:    Contraindications:  Neck treatment contraindications   Neck treatment contraindications:  Hyper/hypotension Objective:    Location of swelling:  right, left, toes, foot, calf, ankle and knee   Fold thickness:  Normal   Fibrosis: No     Skin assessment:  Discoloration, edema and dry skin   Skin assessment comments:  Hyperpigmentation, hemosiderin staining present primarily on medial lower BLE   Color: see skin assessment.   Temperature:  Normal   Hair growth:  Reduced   Stemmer's sign:  Negative   Wound: Yes     Location:  Foot   Description: healing.   Pitting: Yes     Numbers; edema:  1+   Additional objective findings:   RLE:   MTP: 25cm Heel: 34cm Ankle: 26cm 10 cm: 29.5cm 20 cm: 35cm Knee:  40.1cm   LLE: MTP: 24.5cm Heel: 36cm Ankle: 26.5cm 10 cm: 25cm  20 cm: 35.3cm Knee:  40cm  Posture/Observations:    Sitting:  Rounded shoulders   Standing:  Wide BOS   Sleeping:  Bed ROM and Strength:    ROM and Strength findings:  ROM BLE WFL Strength: 4+/5 BLE Tests:    Lower Extremity Functional Scale Score (out of 80):  29   LLI Scale:  29   I attest that I have reviewed the above information. Signed: Lauraine JAYSON Custard, OT 12/29/2022 2:01 PM

## 2023-10-20 NOTE — ED Provider Notes (Signed)
 Emergency Department Provider Note    ED Clinical Impression   Final diagnoses:  Injury of head, initial encounter (Primary)  Fall, initial encounter    ED Assessment/Plan    Condition: Stable Disposition: Discharge  This chart has been completed using Dragon Medical Dictation software, and while attempts have been made to ensure accuracy, certain words and phrases may not be transcribed as intended.   History   Chief Complaint  Patient presents with  . Fall   HPI  William [name] is a 83 y.o. male  who presents today to the  emergency department complaining of a fall that occurred on Tuesday afternoon.  Patient states that he tripped and fell backwards, hit the back of his head.  He suffered a contusion to the back of the head.  He denies loss of consciousness.  He states his PCP recommended him to come and get checked out to make sure thing was okay.  He has no other complaints at this time.    Allergies: has no known allergies. Medications: has a current medication list which includes the following long-term medication(s): aspirin , atorvastatin , gabapentin , and lisinopril. PMHx:  has a past medical history of Allergies, Arthritis, COPD (chronic obstructive pulmonary disease) (CMS-HCC), DDD (degenerative disc disease), lumbar, Diabetes mellitus, type 2 (CMS-HCC), High cholesterol, Hypertension, Meniere's disease, Neuropathy, PAD (peripheral artery disease) (CMS-HCC), and SCC (squamous cell carcinoma), hand. PSHx:  has a past surgical history that includes Appendectomy; Spine surgery (Midline, 1997, 2013); Joint replacement (Left); Joint replacement (Right); pr exc skin benig >4 cm trunk,arm,leg (N/A, 05/29/2020); and pr exc skin benig 2.1-3 cm trunk,arm,leg (N/A, 05/29/2020). SocHx:  reports that he has never smoked. He has been exposed to tobacco smoke. He has never used smokeless tobacco. He  reports that he does not drink alcohol  and does not use drugs. Allergies, Medications, Medical, Surgical, and Social History were reviewed as documented above.   Social Drivers of Health with Concerns   Food Insecurity: Not on file  Internet Connectivity: Not on file  Housing/Utilities: Not on file  Transportation Needs: Not on file  Alcohol  Use: Not on file  Interpersonal Safety: Not on file  Physical Activity: Not on file  Intimate Partner Violence: Not on file  Stress: Not on file  Substance Use: Not on file (07/27/2023)  Social Connections: Not on file  Financial Resource Strain: Not on file  Depression: Not on file     Review Of Systems  Review of Systems  Constitutional:  Negative for fever.  HENT:  Negative for congestion.   Respiratory:  Negative for chest tightness and shortness of breath.   Cardiovascular:  Negative for chest pain.  Gastrointestinal:  Negative for abdominal pain.  Skin:  Negative for color change.  Psychiatric/Behavioral:  Negative for behavioral problems.   All other systems reviewed and are negative.   Physical Exam   BP 140/85   Pulse 71   Temp 36.9 C (98.5 F) (Oral)   Resp 17   Ht 180.3 cm (5' 11)   Wt 80.6 kg (177 lb 9.6 oz)   SpO2 98%   BMI 24.77 kg/m   Physical Exam Vitals and nursing note reviewed.  Constitutional:      General: He is not in acute distress. HENT:     Head: Normocephalic.  Comments: There is an occipital scalp contusion. Eyes:     Conjunctiva/sclera: Conjunctivae normal.  Neck:     Comments: No midline tenderness. Cardiovascular:     Rate and Rhythm: Regular rhythm.     Pulses: Normal pulses.     Heart sounds: Normal heart sounds.  Pulmonary:     Effort: No respiratory distress.     Breath sounds: Normal breath sounds.  Abdominal:     General: There is no distension.     Tenderness: There is no abdominal tenderness. There is no guarding.  Musculoskeletal:        General: No deformity.      Cervical back: Normal range of motion and neck supple. No tenderness.  Skin:    General: Skin is warm.     Capillary Refill: Capillary refill takes 2 to 3 seconds.     Comments: Normal cap refill.  Neurological:     General: No focal deficit present.     Mental Status: He is oriented to person, place, and time.     Cranial Nerves: No cranial nerve deficit.     Sensory: No sensory deficit.     Motor: No weakness.     Deep Tendon Reflexes: Reflexes normal.     Comments: There is no motor, sensory or cerebellar deficits.  Nonfocal neurologic exam.  GCS is 15.  Cranial nerves grossly intact.  NIH stroke score is 0.  Psychiatric:        Mood and Affect: Mood normal.     ED Course  Medical Decision Making Clinical picture suggests  mechanical fall. Will get head Ct to r/o intracranial bleed.  11:30 AM Pt is doing well. Ct negative. He is table for discharge.   I have reviewed my clinical findings and studies and my clinical impression with the patient. The patient has expressed understanding that at this time there is no evidence for a more malignant underlying process, but the patient also understands that early in the process of a condition such as this, an initial workup can be falsely reassuring. I have counseled the patient and discussed follow-up with the patient, stressing the importance of appropriate follow-up. I have also counseled the patient to return if worse or any concerns. Routine discharge counseling was given to the patient and the patient understands that worsening, changing or persistent symptoms should prompt an immediate call or follow up with their primary physician or return to the emergency department for reevaluation. Patient has expressed understanding.     Problems Addressed: Fall, initial encounter: acute illness or injury that poses a threat to life or bodily functions Injury of head, initial encounter: acute illness or injury that poses a threat to life or  bodily functions  Amount and/or Complexity of Data Reviewed Radiology: ordered. Decision-making details documented in ED Course.  Risk OTC drugs. Decision regarding hospitalization.     Procedures   No results found for this visit on 10/20/23 (from the past 4464 hours).   ED Results No results found for any visits on 10/20/23. No results found.   Medications Administered: Medications - No data to display  Discharge Medications (Medications Prescribed during this  ED visit and Patient's Home Medications) :    Your Medication List     ASK your doctor about these medications    aspirin  81 MG tablet Commonly known as: ECOTRIN Take 81 mg by mouth.   atorvastatin  10 MG tablet Commonly known as: LIPITOR Take 1 tablet (10 mg total) by mouth  daily.   CENTRUM ADULT 50 FRESH-FRUITY 120 mcg Chew Generic drug: multivit with min-folic acid Chew 1 Dose  in the morning.   ciprofloxacin HCl 500 MG tablet Commonly known as: CIPRO Take 1 tablet (500 mg total) by mouth two (2) times a day.   gabapentin  300 MG capsule Commonly known as: NEURONTIN  gabapentin  300 mg capsule   lisinopril 20 MG tablet Commonly known as: PRINIVIL,ZESTRIL   lysine 500 mg Tab L-Lysine 500 mg tablet  Take by oral route.   red yeast rice 600 mg Cap Take 1,200 mg by mouth.   traMADol 50 mg tablet Commonly known as: ULTRAM tramadol 50 mg tablet   vitamin B-12 100 MCG tablet Generic drug: cyanocobalamin Take 1 tablet (100 mcg total) by mouth daily.   VITAMIN C 1,000 mg Tber Generic drug: ascorbic acid (vitamin C) Take 1,000 mg by mouth two (2) times a day.   zinc  acetate 50 mg (zinc ) Cap Take 50 mg by mouth in the morning.          Cherie Ardeen Hanger, MD 10/23/23 [phone]

## 2024-02-21 ENCOUNTER — Ambulatory Visit (INDEPENDENT_AMBULATORY_CARE_PROVIDER_SITE_OTHER): Admitting: Vascular Surgery

## 2024-02-21 ENCOUNTER — Encounter: Payer: Self-pay | Admitting: Vascular Surgery

## 2024-02-21 VITALS — BP 157/81 | HR 60 | Ht 71.0 in | Wt 185.0 lb

## 2024-02-21 DIAGNOSIS — I739 Peripheral vascular disease, unspecified: Secondary | ICD-10-CM | POA: Diagnosis not present

## 2024-02-21 NOTE — Progress Notes (Signed)
 Patient name: William [name] MRN: [id number] DOB: July 09, 1941 Sex: male  REASON FOR CONSULT: Ulcer between toe of right foot  HPI: William [name] is a 83 y.o. male, with history of hypertension, hyperlipidemia, COPD, diabetes that presents for evaluation of ulcer on his right foot.  Patient has previously been evaluated by Dr. Shirley Douglas in 2024.  Has a history of venous hypertension and has had multiple lower extremity venous treatments bilaterally he believes were strippings.  States he developed an ulcer on his right second toe and between the 3rd and 4th toe.  These are healing and have nearly healed.  He did have ABIs that were 0.74 on the right and 0.76 on the left.  Past Medical History:  Diagnosis Date   Arthritis    Arthritis 03/24/2017   Benign essential hypertension 03/24/2017   Cancer (HCC) 2014   skin ca, basal cell   COPD (chronic obstructive pulmonary disease) (HCC) 10/26/2022   Degeneration of lumbar intervertebral disc 11/09/2018   Diabetes mellitus    borderline controlled with diet, checks hemaglobin A 1 C every 3-4 months   Diabetic ulcer of toe of right foot associated with type 2 diabetes mellitus, limited to breakdown of skin (HCC) 10/26/2022   Last Assessment & Plan: Formatting of this note might be different from the original. Mabel Savage 2 diabetic foot ulcers left and right foot Abnormal ABIs refer to vein and vascular specialist Debridement today dressing changes with Opticell AG 4 x 4's Kerlix Ace change Monday Wednesday Friday Obtain 3 view x-ray bilateral feet rule out osteomyelitis Maintain blood sugar (less than 180 mg/dL) Supplemen   Epidermoid cyst 05/05/2020   Last Assessment & Plan: Formatting of this note might be different from the original. 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   Hyperlipemia    Hypertensive disorder 09/24/2021   Lumbar post-laminectomy syndrome 11/09/2018   Lumbar spondylosis 01/18/2019   Neuromuscular disorder (HCC)    numbness/tingling lower extremeties   Neuropathy 10/26/2022   Overweight (BMI 25.0-29.9) 06/05/2014   PAD (peripheral artery disease) (HCC) 10/26/2022   S/P left TH revision 06/03/2014   Spinal stenosis of lumbar region 10/20/2018   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   Trochanteric bursitis of right hip 10/20/2018   Type 2 diabetes mellitus with diabetic polyneuropathy, with long-term current use of insulin (HCC) 10/26/2022    Past Surgical History:  Procedure Laterality Date   ACETABULAR REVISION Left 06/03/2014   Procedure: LEFT ACETABULAR REVISION;  Surgeon: Bevin Bucks, MD;  Location: [facility];  Service: Orthopedics;  Laterality: Left;   APPENDECTOMY  1960   BACK SURGERY    also done july 2013   low back surgery right side 1997   COLONOSCOPY  10/05/2012   Procedure: COLONOSCOPY;  Surgeon: Ruby Corporal, MD;  Location: [facility];  Service: Endoscopy;  Laterality: N/A;  830   COLONOSCOPY N/A 05/31/2018   Procedure: COLONOSCOPY;  Surgeon: Ruby Corporal, MD;  Location: [facility];  Service: Endoscopy;  Laterality: N/A;  12:00   JOINT REPLACEMENT     PARTIAL HIP ARTHROPLASTY  2002   x 1   SKIN CANCER EXCISION     TONSILLECTOMY  as child   TOTAL HIP ARTHROPLASTY Left    x 2   TOTAL HIP ARTHROPLASTY Right 2006    Family History  Problem  Relation Age of Onset   Heart Problems Mother    Congestive Heart Failure Mother    Heart attack Father     SOCIAL HISTORY: Social History   Socioeconomic History   Marital status: Married    Spouse name: Not on file   Number of children: Not on file   Years of education: Not on file   Highest education level: Not on file  Occupational History   Not on file  Tobacco Use   Smoking status: Never   Smokeless tobacco: Never  Vaping Use   Vaping status: Never Used  Substance and Sexual Activity   Alcohol  use: No   Drug use: No   Sexual activity: Not on file  Other Topics Concern   Not on file  Social History Narrative   Not on file   Social Drivers of Health   Financial Resource Strain: Not on file  Food Insecurity: Not on file  Transportation Needs: Not on file  Physical Activity: Not on file  Stress: Not on file  Social Connections: Not on file  Intimate Partner Violence: Not on file    No Known Allergies  Current Outpatient Medications  Medication Sig Dispense Refill   Ascorbic Acid 1000 MG TBCR Take 1 tablet by mouth 2 (two) times daily.     aspirin  EC 81 MG tablet Take 81 mg by mouth daily.     atorvastatin  (LIPITOR) 10 MG tablet Take 10 mg by mouth daily.     cetirizine (ZYRTEC) 10 MG tablet Take 10 mg by mouth daily as needed. For allergies     cyanocobalamin 100 MCG tablet Take 1 tablet by mouth daily.     diclofenac (VOLTAREN) 75 MG EC tablet Take 75 mg by mouth 2 (two) times daily.     gabapentin  (NEURONTIN ) 300 MG capsule Take 300 mg by mouth 3 (three) times daily.     lisinopril (ZESTRIL) 20 MG tablet Take 20 mg by mouth daily.     LUMIGAN 0.01 % SOLN Place 1 drop into both eyes at bedtime.     Lysine 500 MG CAPS Take 1 capsule by mouth daily.     metFORMIN (GLUCOPHAGE-XR) 500 MG 24 hr tablet Take 500 mg by mouth daily.     Multiple Vitamin (MULTIVITAMIN WITH MINERALS) TABS Take 1 tablet by mouth daily.     traMADol (ULTRAM) 50 MG tablet Take 50 mg by  mouth every 6 (six) hours as needed for moderate pain.      Zinc  Acetate 50 MG CAPS Take 1 capsule by mouth daily.     Red Yeast Rice 600 MG CAPS Take 1,200 mg by mouth daily.  (Patient not taking: Reported on 02/21/2024)     No current facility-administered medications for this visit.    REVIEW OF SYSTEMS:  [X]  denotes positive finding, [ ]  denotes negative finding Cardiac  Comments:  Chest pain or chest pressure:    Shortness of breath upon  exertion:    Short of breath when lying flat:    Irregular heart rhythm:        Vascular    Pain in calf, thigh, or hip brought on by ambulation:    Pain in feet at night that wakes you up from your sleep:     Blood clot in your veins:    Leg swelling:         Pulmonary    Oxygen at home:    Productive cough:     Wheezing:         Neurologic    Sudden weakness in arms or legs:     Sudden numbness in arms or legs:     Sudden onset of difficulty speaking or slurred speech:    Temporary loss of vision in one eye:     Problems with dizziness:         Gastrointestinal    Blood in stool:     Vomited blood:         Genitourinary    Burning when urinating:     Blood in urine:        Psychiatric    Major depression:         Hematologic    Bleeding problems:    Problems with blood clotting too easily:        Skin    Rashes or ulcers:        Constitutional    Fever or chills:      PHYSICAL EXAM: Vitals:   02/21/24 1358  BP: (!) 157/81  Pulse: 60  SpO2: 99%  Weight: 185 lb (83.9 kg)  Height: 5\' 11"  (1.803 m)    GENERAL: The patient is a well-nourished male, in no acute distress. The vital signs are documented above. CARDIAC: There is a regular rate and rhythm.  VASCULAR:  Bilateral femoral and popliteal pulses palpable No palpable pedal pulses Small ulcer on the right second and between the 3rd and 4th toe nearly completely healed PULMONARY: no respiratory distress. ABDOMEN: Soft and non-tender.  MUSCULOSKELETAL: There  are no major deformities or cyanosis. NEUROLOGIC: No focal weakness or paresthesias are detected. SKIN: There are no ulcers or rashes noted. PSYCHIATRIC: The patient has a normal affect.  DATA:   ABIs that were 0.74 on the right and 0.76 on the left.  Assessment/Plan:  83 y.o. male, with history of hypertension, hyperlipidemia, COPD, diabetes that presents for evaluation of ulcer on his right foot.  Patient has previously been evaluated by Dr. Shirley Douglas in 2024.  Has a history of venous hypertension and has had multiple lower extremity venous treatments bilaterally he believes were strippings.  States he developed an ulcer on his right second toe and between the 3rd and 4th toe.   These have essentially healed on exam today.  I do not see any role for further invasive arteriography at this time to evaluate his runoff.   I discussed taking a conservative approach.  He can let me know if these wounds worsen.  Has known chronic venous disease.   Young Hensen, MD [facility] Office: [phone]

## 2024-04-17 ENCOUNTER — Encounter: Payer: Self-pay | Admitting: Diagnostic Neuroimaging

## 2024-04-17 ENCOUNTER — Ambulatory Visit (INDEPENDENT_AMBULATORY_CARE_PROVIDER_SITE_OTHER): Admitting: Diagnostic Neuroimaging

## 2024-04-17 VITALS — BP 130/68 | HR 59 | Ht 71.0 in | Wt 172.8 lb

## 2024-04-17 DIAGNOSIS — R200 Anesthesia of skin: Secondary | ICD-10-CM

## 2024-04-17 NOTE — Patient Instructions (Addendum)
 Numbness in feet / bilateral foot drop weakness (since ~2015; mainly related to history of lumbar spinal stenosis s/p surgery in 2013; interval severe spinal stenosis noted in 2016, but no further treatment; also bilateral hip surgeries x 4; some symptoms could be aggravated by chronic venous insufficiency and foot swelling; will also check neuropathy workup)  - elevate feet at night; use compression stockings  - check neuropathy labs  - gabapentin  not working that well; cannot tolerate higher doses; may consider to wean off  Consider other painful neuropathy treatment options (will ask PCP to review and consider tx): -duloxetine 30-60mg  daily, amitriptyline 25-50mg  at bedtime, pregabalin 75-150mg  twice a day -capsaicin cream, lidocaine  patch / cream, alpha-lipoic acid 600mg  daily

## 2024-04-17 NOTE — Progress Notes (Signed)
 [facility]  PATIENT: William [name] DOB: [birth year]  REFERRING CLINICIAN: Blinda Katz, DPM HISTORY FROM: patient  REASON FOR VISIT: new consult   HISTORICAL  CHIEF COMPLAINT:  Chief Complaint  Patient presents with   RM6/NEUROPATHY    Pt is here with his Wife. Pt states that he is on Gabapentin  and it helps some. Pt states that he has a dull feeling in his feet. Pt states that he sometimes have a dull and asleep feeling in his hands every now and then.     HISTORY OF PRESENT ILLNESS:   83 year old male here for evaluation of numbness in the feet.  Symptoms started around 10 years ago with some numbness in the toes and feet.  Also with some weakness in feet and legs, gait difficulties.  The started following severe lumbar spinal stenosis and surgery in 2013.  Follow-up imaging after surgery demonstrated worsening adjacent segment disease and severe spinal stenosis.  This was not treated with surgery and managed conservatively.  Also has had 4 hip surgeries over the years.  Patient referred here for evaluation of numbness and neuropathy evaluation.  Has tried gabapentin  without relief.  Not able to take higher doses due to side effects.   REVIEW OF SYSTEMS: Full 14 system review of systems performed and negative with exception of: as per HPI.  ALLERGIES: No Known Allergies  HOME MEDICATIONS: Outpatient Medications Prior to Visit  Medication Sig Dispense Refill   aspirin  EC 81 MG tablet Take 81 mg by mouth daily.     atorvastatin  (LIPITOR) 10 MG tablet Take 10 mg by mouth daily.     cetirizine (ZYRTEC) 10 MG tablet Take 10 mg by mouth daily as needed. For allergies     cyanocobalamin 100 MCG tablet Take 1 tablet by mouth daily.     diclofenac (VOLTAREN) 75 MG EC tablet Take 75 mg by mouth 2 (two) times daily.     gabapentin  (NEURONTIN ) 300 MG capsule Take 300 mg by mouth 3 (three) times daily.     lisinopril (ZESTRIL) 20 MG tablet Take 20 mg by  mouth daily.     LUMIGAN 0.01 % SOLN Place 1 drop into both eyes at bedtime.     Lysine 500 MG CAPS Take 1 capsule by mouth daily.     metFORMIN (GLUCOPHAGE-XR) 500 MG 24 hr tablet Take 500 mg by mouth daily.     Multiple Vitamin (MULTIVITAMIN WITH MINERALS) TABS Take 1 tablet by mouth daily.     traMADol (ULTRAM) 50 MG tablet Take 50 mg by mouth every 6 (six) hours as needed for moderate pain.      Ascorbic Acid 1000 MG TBCR Take 1 tablet by mouth 2 (two) times daily. (Patient not taking: Reported on 04/17/2024)     Red Yeast Rice 600 MG CAPS Take 1,200 mg by mouth daily.  (Patient not taking: Reported on 04/17/2024)     Zinc  Acetate 50 MG CAPS Take 1 capsule by mouth daily. (Patient not taking: Reported on 04/17/2024)     No facility-administered medications prior to visit.    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 Alona 2 diabetic foot ulcers left and right foot Abnormal ABIs refer to vein and vascular specialist Debridement today dressing changes with Opticell AG 4 x 4's Kerlix Ace change Monday Wednesday Friday Obtain 3 view x-ray bilateral feet rule out osteomyelitis Maintain blood sugar (less than 180 mg/dL) Supplemen   Epidermoid cyst 05/05/2020   Last Assessment & Plan: Formatting of this note might be different from the original. 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   Hyperlipemia    Hypertensive disorder 09/24/2021   Lumbar post-laminectomy syndrome 11/09/2018   Lumbar spondylosis 01/18/2019   Neuromuscular disorder (HCC)    numbness/tingling lower extremeties   Neuropathy 10/26/2022   Overweight (BMI 25.0-29.9) 06/05/2014   PAD (peripheral artery disease) (HCC) 10/26/2022   S/P left TH revision 06/03/2014   Spinal stenosis of lumbar region 10/20/2018   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   Trochanteric bursitis of right hip 10/20/2018   Type 2 diabetes mellitus with diabetic polyneuropathy, with long-term current use of insulin (HCC) 10/26/2022    PAST SURGICAL HISTORY: Past Surgical History:  Procedure Laterality Date   ACETABULAR REVISION Left 06/03/2014   Procedure: LEFT ACETABULAR REVISION;  Surgeon: Donnice JONETTA Car, MD;  Location: [facility];  Service: Orthopedics;  Laterality: Left;   APPENDECTOMY  1960   BACK SURGERY    also done july 2013   low back surgery right side 1997   COLONOSCOPY  10/05/2012   Procedure: COLONOSCOPY;  Surgeon: Claudis RAYMOND Rivet, MD;  Location: [facility];  Service: Endoscopy;  Laterality: N/A;  830   COLONOSCOPY N/A 05/31/2018   Procedure: COLONOSCOPY;  Surgeon: Rivet Claudis RAYMOND, MD;  Location: [facility];  Service: Endoscopy;  Laterality: N/A;  12:00   JOINT REPLACEMENT     PARTIAL HIP ARTHROPLASTY  2002   x 1   SKIN CANCER  EXCISION     TONSILLECTOMY  as child   TOTAL HIP ARTHROPLASTY Left    x 2   TOTAL HIP ARTHROPLASTY Right 2006    FAMILY HISTORY: Family History  Problem Relation Age of Onset   Heart Problems Mother    Congestive Heart Failure Mother    Heart attack Father     SOCIAL HISTORY: Social History   Socioeconomic History   Marital status: Married    Spouse name: Not on file   Number of children: Not on file   Years of education: Not on file   Highest education level: Not on file  Occupational History   Not on file  Tobacco Use   Smoking status: Never   Smokeless tobacco:  Never  Vaping Use   Vaping status: Never Used  Substance and Sexual Activity   Alcohol  use: No   Drug use: No   Sexual activity: Not on file  Other Topics Concern   Not on file  Social History Narrative   Not on file   Social Drivers of Health   Financial Resource Strain: Not on file  Food Insecurity: Not on file  Transportation Needs: Not on file  Physical Activity: Not on file  Stress: Not on file  Social Connections: Not on file  Intimate Partner Violence: Not on file     PHYSICAL EXAM  GENERAL EXAM/CONSTITUTIONAL: Vitals:  Vitals:   04/17/24 0919  BP: 130/68  Pulse: (!) 59  SpO2: 99%  Weight: 172 lb 12.8 oz (78.4 kg)  Height: 5' 11 (1.803 m)   Body mass index is 24.1 kg/m. Wt Readings from Last 3 Encounters:  04/17/24 172 lb 12.8 oz (78.4 kg)  02/21/24 185 lb (83.9 kg)  12/01/22 184 lb 12.8 oz (83.8 kg)   Patient is in no distress; well developed, nourished and groomed; neck is supple  CARDIOVASCULAR: Examination of carotid arteries is normal; no carotid bruits Regular rate and rhythm, no murmurs Examination of peripheral vascular system by observation and palpation is normal  EYES: Ophthalmoscopic exam of optic discs and posterior segments is normal; no papilledema or hemorrhages No results found.  MUSCULOSKELETAL: Gait, strength, tone, movements noted in Neurologic  exam below  NEUROLOGIC: MENTAL STATUS:      No data to display         awake, alert, oriented to person, place and time recent and remote memory intact normal attention and concentration language fluent, comprehension intact, naming intact fund of knowledge appropriate  CRANIAL NERVE:  2nd - no papilledema on fundoscopic exam 2nd, 3rd, 4th, 6th - pupils equal and reactive to light, visual fields full to confrontation, extraocular muscles intact, no nystagmus 5th - facial sensation symmetric 7th - facial strength symmetric 8th - hearing intact 9th - palate elevates symmetrically, uvula midline 11th - shoulder shrug symmetric 12th - tongue protrusion midline  MOTOR:  normal bulk and tone, full strength in the BUE, BLE EXCEPT: LEFT DELTOID 3 (LIMITED BY PAIN); BILATERAL FOOT DF 2-3; TOE ABDUCTION / EXTENSION 2 SWOLLEN FEET / TOES; CHRONIC VENOUS INSUFFICIENCY CHANGES IN ANKLES AND TOES  SENSORY:  normal and symmetric to light touch, pinprick, temperature, vibration; EXCEPT SIG DECR IN ANKLES AND FEET  COORDINATION:  finger-nose-finger, fine finger movements normal  REFLEXES:  deep tendon reflexes TRACE and symmetric  GAIT/STATION:  narrow based gait; USING WALKER     DIAGNOSTIC DATA (LABS, IMAGING, TESTING) - I reviewed patient records, labs, notes, testing and imaging myself where available.  Lab Results  Component Value Date   WBC 4.7 10/06/2017   HGB 13.2 10/06/2017   HCT 37.2 (L) 10/06/2017   MCV 91.9 10/06/2017   PLT 207 10/06/2017      Component Value Date/Time   NA 141 06/05/2014 0447   K 4.3 06/05/2014 0447   CL 106 06/05/2014 0447   CO2 27 06/05/2014 0447   GLUCOSE 137 (H) 06/05/2014 0447   BUN 18 06/05/2014 0447   CREATININE 0.83 06/05/2014 0447   CALCIUM  8.3 (L) 06/05/2014 0447   GFRNONAA 85 (L) 06/05/2014 0447   GFRAA >90 06/05/2014 0447   No results found for: CHOL, HDL, LDLCALC, LDLDIRECT, TRIG, CHOLHDL No results found for:  HGBA1C No results found for: VITAMINB12 No results found for: TSH  06/27/15 CT myelogram [I reviewed images myself and agree with interpretation. -VRP]  IMPRESSION: 1. Interval progression of multifactorial spinal stenosis, severe at L1-2, moderately severe L2-3. 2. Bilateral foraminal encroachment L3-4 right worse than left. 3. Solid-appearing PLIF L4-5 without apparent complication. 4. Cholelithiasis.    ASSESSMENT AND PLAN  83 y.o. year old male here with:   Dx:  1. Numbness in feet      PLAN:  Numbness in feet / bilateral foot drop weakness (since ~2015; mainly related to history of lumbar spinal stenosis s/p surgery in 2013; interval severe spinal stenosis noted in 2016, but no further treatment; also bilateral hip surgeries x 4; some symptoms could be aggravated by chronic venous insufficiency and foot swelling; will also check neuropathy workup)  - elevate feet at night; use compression stockings  - check neuropathy labs  - gabapentin  not working that well; cannot tolerate higher doses; may consider to wean off  Consider other painful neuropathy treatment options (will ask PCP to review and consider tx): -duloxetine 30-60mg  daily, amitriptyline 25-50mg  at bedtime, pregabalin 75-150mg  twice a day -capsaicin cream, lidocaine  patch / cream, alpha-lipoic acid 600mg  daily  Orders Placed This Encounter  Procedures   CBC with diff   CMP   Vitamin B12   A1c   TSH   SPEP with IFE   ANA w/Reflex   SSA, SSB   Return for pending if symptoms worsen or fail to improve, pending test results.    EDUARD FABIENE HANLON, MD 04/17/2024, 10:31 AM Certified in Neurology, Neurophysiology and Neuroimaging  [facility] [address] [place], [place] [address] [phone]

## 2024-04-19 LAB — COMPREHENSIVE METABOLIC PANEL WITH GFR
ALT: 18 IU/L (ref 0–44)
AST: 20 IU/L (ref 0–40)
Albumin: 4.5 g/dL (ref 3.7–4.7)
Alkaline Phosphatase: 61 IU/L (ref 44–121)
BUN/Creatinine Ratio: 23 (ref 10–24)
BUN: 22 mg/dL (ref 8–27)
Bilirubin Total: 0.5 mg/dL (ref 0.0–1.2)
CO2: 22 mmol/L (ref 20–29)
Calcium: 9.5 mg/dL (ref 8.6–10.2)
Chloride: 103 mmol/L (ref 96–106)
Creatinine, Ser: 0.94 mg/dL (ref 0.76–1.27)
Globulin, Total: 2.4 g/dL (ref 1.5–4.5)
Glucose: 86 mg/dL (ref 70–99)
Potassium: 5.5 mmol/L — ABNORMAL HIGH (ref 3.5–5.2)
Sodium: 140 mmol/L (ref 134–144)
Total Protein: 6.9 g/dL (ref 6.0–8.5)
eGFR: 80 mL/min/1.73 (ref >59)

## 2024-04-19 LAB — TSH: TSH: 1.32 u[IU]/mL (ref 0.450–4.500)

## 2024-04-19 LAB — CBC WITH DIFFERENTIAL/PLATELET
Basophils Absolute: 0 x10E3/uL (ref 0.0–0.2)
Basos: 1 %
EOS (ABSOLUTE): 0.1 x10E3/uL (ref 0.0–0.4)
Eos: 1 %
Hematocrit: 40.8 % (ref 37.5–51.0)
Hemoglobin: 13.3 g/dL (ref 13.0–17.7)
Immature Grans (Abs): 0 x10E3/uL (ref 0.0–0.1)
Immature Granulocytes: 0 %
Lymphocytes Absolute: 1.4 x10E3/uL (ref 0.7–3.1)
Lymphs: 26 %
MCH: 32.6 pg (ref 26.6–33.0)
MCHC: 32.6 g/dL (ref 31.5–35.7)
MCV: 100 fL — ABNORMAL HIGH (ref 79–97)
Monocytes Absolute: 0.4 x10E3/uL (ref 0.1–0.9)
Monocytes: 7 %
Neutrophils Absolute: 3.5 x10E3/uL (ref 1.4–7.0)
Neutrophils: 65 %
Platelets: 225 x10E3/uL (ref 150–450)
RBC: 4.08 x10E6/uL — ABNORMAL LOW (ref 4.14–5.80)
RDW: 11.6 % (ref 11.6–15.4)
WBC: 5.4 x10E3/uL (ref 3.4–10.8)

## 2024-04-19 LAB — ENA+DNA/DS+ANTICH+CENTRO+FA...
Anti JO-1: <0.2 AI (ref 0.0–0.9)
Antiribosomal P Antibodies: <0.2 AI (ref 0.0–0.9)
Centromere Ab Screen: <0.2 AI (ref 0.0–0.9)
Chromatin Ab SerPl-aCnc: <0.2 AI (ref 0.0–0.9)
ENA RNP Ab: 0.2 AI (ref 0.0–0.9)
ENA SM Ab Ser-aCnc: <0.2 AI (ref 0.0–0.9)
Nucleolar Pattern: 1:160 {titer} — ABNORMAL HIGH
Scleroderma (Scl-70) (ENA) Antibody, IgG: <0.2 AI (ref 0.0–0.9)
Smith/RNP Antibodies: <0.2 AI (ref 0.0–0.9)
dsDNA Ab: 26 [IU]/mL — ABNORMAL HIGH (ref 0–9)

## 2024-04-19 LAB — MULTIPLE MYELOMA PANEL, SERUM
Albumin SerPl Elph-Mcnc: 3.9 g/dL (ref 2.9–4.4)
Albumin/Glob SerPl: 1.4 (ref 0.7–1.7)
Alpha 1: 0.2 g/dL (ref 0.0–0.4)
Alpha2 Glob SerPl Elph-Mcnc: 0.7 g/dL (ref 0.4–1.0)
B-Globulin SerPl Elph-Mcnc: 1 g/dL (ref 0.7–1.3)
Gamma Glob SerPl Elph-Mcnc: 1.1 g/dL (ref 0.4–1.8)
Globulin, Total: 3 g/dL (ref 2.2–3.9)
IgA/Immunoglobulin A, Serum: 320 mg/dL (ref 61–437)
IgG (Immunoglobin G), Serum: 1147 mg/dL (ref 603–1613)
IgM (Immunoglobulin M), Srm: 80 mg/dL (ref 15–143)

## 2024-04-19 LAB — SJOGREN'S SYNDROME ANTIBODS(SSA + SSB)
ENA SSA (RO) Ab: 0.6 AI (ref 0.0–0.9)
ENA SSB (LA) Ab: <0.2 AI (ref 0.0–0.9)

## 2024-04-19 LAB — VITAMIN B12: Vitamin B-12: 1598 pg/mL — ABNORMAL HIGH (ref 232–1245)

## 2024-04-19 LAB — ANA W/REFLEX: ANA Titer 1: POSITIVE — AB

## 2024-04-19 LAB — HEMOGLOBIN A1C
Est. average glucose Bld gHb Est-mCnc: 128 mg/dL
Hgb A1c MFr Bld: 6.1 % — ABNORMAL HIGH (ref 4.8–5.6)

## 2024-05-06 ENCOUNTER — Ambulatory Visit: Payer: Self-pay | Admitting: Diagnostic Neuroimaging

## 2024-05-06 DIAGNOSIS — M48062 Spinal stenosis, lumbar region with neurogenic claudication: Secondary | ICD-10-CM

## 2024-05-06 DIAGNOSIS — R768 Other specified abnormal immunological findings in serum: Secondary | ICD-10-CM

## 2024-05-06 DIAGNOSIS — G629 Polyneuropathy, unspecified: Secondary | ICD-10-CM

## 2024-05-07 ENCOUNTER — Telehealth: Payer: Self-pay | Admitting: Diagnostic Neuroimaging

## 2024-05-07 NOTE — Telephone Encounter (Signed)
 Pt called to request a call about Lab results , Pt states he hasn't heard anything and was concern

## 2024-05-07 NOTE — Telephone Encounter (Signed)
 Referral for Rheumatology sent thru Epic to  [facility]  [facility] [phone]

## 2024-05-07 NOTE — Telephone Encounter (Signed)
-----   Message from Eduard SAUNDERS [facility] sent at 05/06/2024  7:16 PM EDT ----- Labs notable for borderline elevated A1c and potassium (can be repeated with PCP). Also ANA is positive and may indicate an autoimmune condition related to arthritis or neuropathy. Will request  rheumatology consult. -VRP  ----- Message ----- From: Interface, [facility] Lab Results In Sent: 04/18/2024   7:37 AM EDT To: Eduard SAUNDERS Hanlon, MD

## 2024-05-07 NOTE — Telephone Encounter (Signed)
 Called the patient and reviewed the lab results with him. Informed him that I will send the PCP a copy of the results so they can follow up on A1C level and potassium level. Advised a referral to rheumatology was placed to evaluate further on the + ANA results. Pt verbalized understanding. Pt had no questions at this time but was encouraged to call back if questions arise.

## 2024-05-21 NOTE — Progress Notes (Signed)
 05/22/2024 8:29 PM   Lynwood DELENA Angle 07/02/1941 [id number]  Referring provider: Rosamond Leta NOVAK, MD [address] [place],  [place] [address]  No chief complaint on file.   HPI: 83 yo male sent by Dr Rosamond for E/M of elevated PSA. Recent PSA--102.     PMH: Past Medical History:  Diagnosis Date   Arthritis    Arthritis 03/24/2017   Benign essential hypertension 03/24/2017   Cancer (HCC) 2014   skin ca, basal cell   COPD (chronic obstructive pulmonary disease) (HCC) 10/26/2022   Degeneration of lumbar intervertebral disc 11/09/2018   Diabetes mellitus    borderline controlled with diet, checks hemaglobin A 1 C every 3-4 months   Diabetic ulcer of toe of right foot associated with type 2 diabetes mellitus, limited to breakdown of skin (HCC) 10/26/2022   Last Assessment & Plan: Formatting of this note might be different from the original. Alona 2 diabetic foot ulcers left and right foot Abnormal ABIs refer to vein and vascular specialist Debridement today dressing changes with Opticell AG 4 x 4's Kerlix Ace change Monday Wednesday Friday Obtain 3 view x-ray bilateral feet rule out osteomyelitis Maintain blood sugar (less than 180 mg/dL) Supplemen   Epidermoid cyst 05/05/2020   Last Assessment & Plan: Formatting of this note might be different from the original. 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   History of total  right hip replacement 09/08/2018   Hyperlipemia    Hypertensive disorder 09/24/2021   Lumbar post-laminectomy syndrome 11/09/2018   Lumbar spondylosis 01/18/2019   Neuromuscular disorder (HCC)    numbness/tingling lower extremeties   Neuropathy 10/26/2022   Overweight (BMI 25.0-29.9) 06/05/2014   PAD (peripheral artery disease) (HCC) 10/26/2022   S/P left TH revision 06/03/2014   Spinal stenosis of lumbar region 10/20/2018   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   Trochanteric bursitis of right hip 10/20/2018   Type 2 diabetes mellitus with diabetic polyneuropathy, with long-term current use of insulin (HCC) 10/26/2022    Surgical History: Past Surgical History:  Procedure Laterality Date   ACETABULAR REVISION Left 06/03/2014   Procedure: LEFT ACETABULAR REVISION;  Surgeon: Donnice JONETTA Car, MD;  Location: [facility];  Service: Orthopedics;  Laterality: Left;   APPENDECTOMY  1960   BACK SURGERY    also done july 2013   low back surgery right side 1997   COLONOSCOPY  10/05/2012   Procedure: COLONOSCOPY;  Surgeon: Claudis RAYMOND Rivet, MD;  [facility]: [facility];  Service: Endoscopy;  Laterality: N/A;  830   COLONOSCOPY N/A 05/31/2018   Procedure: COLONOSCOPY;  Surgeon: Rivet Claudis RAYMOND, MD;  [facility]: [facility];  Service: Endoscopy;  Laterality: N/A;  12:00   JOINT REPLACEMENT     PARTIAL HIP ARTHROPLASTY  2002   x 1   SKIN CANCER EXCISION     TONSILLECTOMY  as child  TOTAL HIP ARTHROPLASTY Left    x 2   TOTAL HIP ARTHROPLASTY Right 2006    Home Medications:  Allergies as of 05/22/2024   No Known Allergies      Medication List        Accurate as of May 21, 2024  8:29 PM. If you have any questions, ask your nurse or doctor.          Ascorbic Acid 1000 MG Tbcr Take 1 tablet by mouth 2 (two) times daily.   aspirin  EC 81 MG tablet Take 81 mg by mouth daily.   atorvastatin  10 MG tablet Commonly known as: LIPITOR Take 10 mg by mouth daily.   cetirizine  10 MG tablet Commonly known as: ZYRTEC Take 10 mg by mouth daily as needed. For allergies   cyanocobalamin 100 MCG tablet Take 1 tablet by mouth daily.   diclofenac 75 MG EC tablet Commonly known as: VOLTAREN Take 75 mg by mouth 2 (two) times daily.   gabapentin  300 MG capsule Commonly known as: NEURONTIN  Take 300 mg by mouth 3 (three) times daily.   lisinopril 20 MG tablet Commonly known as: ZESTRIL Take 20 mg by mouth daily.   Lumigan 0.01 % Soln Generic drug: bimatoprost Place 1 drop into both eyes at bedtime.   Lysine 500 MG Caps Take 1 capsule by mouth daily.   metFORMIN 500 MG 24 hr tablet Commonly known as: GLUCOPHAGE-XR Take 500 mg by mouth daily.   multivitamin with minerals Tabs tablet Take 1 tablet by mouth daily.   Red Yeast Rice 600 MG Caps Take 1,200 mg by mouth daily.   traMADol 50 MG tablet Commonly known as: ULTRAM Take 50 mg by mouth every 6 (six) hours as needed for moderate pain.   Zinc  Acetate 50 MG Caps Take 1 capsule by mouth daily.        Allergies: No Known Allergies  Family History: Family History  Problem Relation Age of Onset   Heart Problems Mother    Congestive Heart Failure Mother    Heart attack Father     Social History:  reports that he has never smoked. He has never used smokeless tobacco. He reports that he does not drink alcohol  and does not use drugs.  ROS: All other review of systems were reviewed and are negative except what is noted above in HPI  Physical Exam: There were no vitals taken for this visit.  Constitutional:  Alert and oriented, No acute distress. HEENT: [place] AT, moist mucus membranes.  Trachea midline, no masses. Cardiovascular: No clubbing, cyanosis, or edema. Respiratory: Normal respiratory effort, no increased work of breathing. GI: No inguinal hernias GU: Normal phallus. No masses/lesions on penis, testis, scrotum. Prostate ***g smooth no nodules no induration.  Lymph: No cervical or inguinal  lymphadenopathy. Skin: No rashes, bruises or suspicious lesions. Neurologic: Grossly intact, no focal deficits, moving all 4 extremities. Psychiatric: Normal mood and affect.  Laboratory Data: Lab Results  Component Value Date   WBC 5.4 04/17/2024   HGB 13.3 04/17/2024   HCT 40.8 04/17/2024   MCV 100 (H) 04/17/2024   PLT 225 04/17/2024    Lab Results  Component Value Date   CREATININE 0.94 04/17/2024    No results found for: PSA  No results found for: TESTOSTERONE  Lab Results  Component Value Date   HGBA1C 6.1 (H) 04/17/2024    Urinalysis   Pertinent Imaging: ***  Assessment:    Plan:    There are no diagnoses  linked to this encounter.  No follow-ups on file.  Garnette CHRISTELLA Shack, MD  [facility] [place]

## 2024-05-22 ENCOUNTER — Ambulatory Visit: Admitting: Urology

## 2024-05-22 VITALS — BP 143/69 | HR 73

## 2024-05-22 DIAGNOSIS — N400 Enlarged prostate without lower urinary tract symptoms: Secondary | ICD-10-CM

## 2024-05-22 DIAGNOSIS — R972 Elevated prostate specific antigen [PSA]: Secondary | ICD-10-CM | POA: Diagnosis not present

## 2024-05-22 LAB — URINALYSIS, ROUTINE W REFLEX MICROSCOPIC
Bilirubin, UA: NEGATIVE
Glucose, UA: NEGATIVE
Ketones, UA: NEGATIVE
Leukocytes,UA: NEGATIVE
Nitrite, UA: NEGATIVE
Protein,UA: NEGATIVE
RBC, UA: NEGATIVE
Specific Gravity, UA: 1.025 (ref 1.005–1.030)
Urobilinogen, Ur: 0.2 mg/dL (ref 0.2–1.0)
pH, UA: 6 (ref 5.0–7.5)

## 2024-06-04 NOTE — Progress Notes (Signed)
 Office Visit Note  Patient: William [name]             Date of Birth: April 30, 1941           MRN: [id number]             PCP: Rosamond Leta NOVAK, MD Referring: Margaret Eduard SAUNDERS, MD Visit Date: 06/07/2024  Subjective:  New Patient (Initial Visit) (Right Shoulder pain for a few months. Patient states he also has pain in his knees that come and go. Does you Voltaren Gel on his knees. Abnormal Labs.)  History of Present Illness: William [name] is a 83 y.o. male presenting for a positive ANA.  Patient states that he has been suffering from neuropathy of his bilateral feet for many years, and he has been on Gabapentin  for his symptoms. He states that the Gabapentin  used to work very well, but has stopped working over the years. He was told about the possibility of adding Cymbalta and/or Lyrica to his drug regimen.   He denies any new symptoms at this time. He denies rashes, oral ulcers, no dry mouth, fevers, photosensitivity, raynaud's, no recurrent cytopenias, chest pain or shortness of breath, foamy urine. He admits to dry eyes, follows with his ophthalmologist regularly. He denies any joint pain or joint swelling. He does admit to issues with his right shoulder for the past few months, pain when he lies down on his right shoulder to sleep at night. He denies any AM stiffness.  Activities of Daily Living:  Patient reports morning stiffness for 0 minutes.   Patient Reports nocturnal pain.  Difficulty dressing/grooming: Denies Difficulty climbing stairs: Reports Difficulty getting out of chair: Reports Difficulty using hands for taps, buttons, cutlery, and/or writing: Denies  Review of Systems  Constitutional:  Positive for fatigue.  HENT:  Negative for mouth sores and mouth dryness.   Eyes:  Positive for dryness.  Respiratory:  Negative for shortness of breath.   Cardiovascular:  Negative for chest pain and palpitations.  Gastrointestinal:  Negative for blood in stool, constipation and  diarrhea.  Endocrine: Positive for increased urination.  Genitourinary:  Negative for involuntary urination.  Musculoskeletal:  Positive for joint pain, gait problem, joint pain, myalgias, muscle weakness, muscle tenderness and myalgias. Negative for joint swelling and morning stiffness.  Skin:  Negative for color change, rash, hair loss and sensitivity to sunlight.  Allergic/Immunologic: Negative for susceptible to infections.  Neurological:  Negative for dizziness and headaches.  Hematological:  Negative for swollen glands.  Psychiatric/Behavioral:  Positive for sleep disturbance. Negative for depressed mood. The patient is not nervous/anxious.      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   Type 2 diabetes mellitus with diabetic polyneuropathy, with long-term current use of insulin (HCC) 10/26/2022   Glaucoma 09/24/2021   Hypertensive disorder 09/24/2021   Epidermoid cyst 05/05/2020   Lumbar spondylosis 01/18/2019   Degeneration of lumbar intervertebral disc 11/09/2018   Lumbar post-laminectomy syndrome 11/09/2018   Spinal stenosis of lumbar region 10/20/2018   Trochanteric bursitis of right hip  10/20/2018   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/08/2018   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   Guaiac positive stools 04/05/2018   Arthritis 03/24/2017   High cholesterol 03/24/2017   Benign essential hypertension 03/24/2017   Overweight (BMI 25.0-29.9) 06/05/2014   Expected blood loss anemia 06/05/2014   S/P left TH  revision 06/03/2014    Past Medical History:  Diagnosis Date   Arthritis    Arthritis 03/24/2017   Benign essential hypertension 03/24/2017   Cancer (HCC) 2014   skin ca, basal cell   COPD (chronic obstructive pulmonary disease) (HCC) 10/26/2022   Degeneration of lumbar intervertebral disc 11/09/2018   Diabetes mellitus    borderline controlled with diet, checks hemaglobin A 1 C every 3-4 months   Diabetic ulcer of toe of right foot associated with type 2 diabetes mellitus, limited to breakdown of skin (HCC) 10/26/2022   Last Assessment & Plan: Formatting of this note might be different from the original. Alona 2 diabetic foot ulcers left and right foot Abnormal ABIs refer to vein and vascular specialist Debridement today dressing changes with Opticell AG 4 x 4's Kerlix Ace change Monday Wednesday Friday Obtain 3 view x-ray bilateral feet rule out osteomyelitis Maintain blood sugar (less than 180 mg/dL) Supplemen   Epidermoid cyst 05/05/2020   Last Assessment & Plan: Formatting of this note might be different from the original. 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   Hyperlipemia    Hypertensive disorder 09/24/2021   Lumbar post-laminectomy syndrome 11/09/2018   Lumbar spondylosis 01/18/2019   Neuromuscular disorder (HCC)    numbness/tingling lower extremeties    Neuropathy 10/26/2022   Overweight (BMI 25.0-29.9) 06/05/2014   PAD (peripheral artery disease) (HCC) 10/26/2022   S/P left TH revision 06/03/2014   Spinal stenosis of lumbar region 10/20/2018   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   Trochanteric bursitis of right hip 10/20/2018   Type 2 diabetes mellitus with diabetic polyneuropathy, with long-term current use of insulin (HCC) 10/26/2022    Family History  Problem Relation Age of Onset   Heart Problems Mother    Congestive Heart Failure Mother    Heart attack Father    Diabetes Brother    Heart Problems Brother    Hypertension Son    Other Daughter        Chron's   Past Surgical History:  Procedure Laterality Date   ACETABULAR REVISION Left 06/03/2014   Procedure: LEFT ACETABULAR REVISION;  Surgeon: Donnice JONETTA Car, MD;  Location: [facility];  Service: Orthopedics;  Laterality: Left;   APPENDECTOMY  1960   BACK SURGERY    also done july 2013   low back surgery right side 1997   COLONOSCOPY  10/05/2012   Procedure: COLONOSCOPY;  Surgeon: Claudis RAYMOND Rivet, MD;  Location: [facility];  Service: Endoscopy;  Laterality: N/A;  830   COLONOSCOPY N/A 05/31/2018   Procedure: COLONOSCOPY;  Surgeon: Golda Claudis PENNER, MD;  Location: [facility];  Service: Endoscopy;  Laterality: N/A;  12:00   JOINT REPLACEMENT     PARTIAL HIP ARTHROPLASTY  2002   x 1   SKIN CANCER EXCISION     TONSILLECTOMY  as child   TOTAL HIP ARTHROPLASTY Left    x 2   TOTAL HIP ARTHROPLASTY Right 2006   Social History   Social History Narrative   Not on file   Immunization History  Administered Date(s) Administered   Pneumococcal Polysaccharide-23 03/30/2012     Objective: Vital Signs: BP 137/71 (BP Location: Right Arm, Patient Position: Sitting, Cuff Size: Small)   Pulse 60   Temp (!) 97.3 F (36.3 C)   Resp 14   Ht 5' 8 (1.727 m)   Wt 171 lb 9.6 oz (77.8 kg)   BMI 26.09 kg/m    Physical Exam Vitals and nursing note reviewed.  HENT:      Head: Normocephalic and atraumatic.     Nose: Nose normal.  Eyes:     Conjunctiva/sclera: Conjunctivae normal.     Pupils: Pupils are equal, round, and reactive to light.  Cardiovascular:     Rate and Rhythm: Normal rate and regular rhythm.     Heart sounds: Normal heart sounds.  Pulmonary:     Effort: Pulmonary effort is normal.     Breath sounds: Normal breath sounds.  Musculoskeletal:     Comments: Tenderness to palpation of right subacromial bursa  Skin:    General: Skin is warm and dry.     Findings: Rash present. Rash is purpuric (left 1st toe, blanches on palpation).  Neurological:     Mental Status: He is alert. Mental status is at baseline.  Psychiatric:        Mood and Affect: Mood normal.        Behavior: Behavior normal.      Musculoskeletal Exam:   CDAI Exam: CDAI Score: -- Patient Global: --; Provider Global: -- Swollen: 0 ; Tender: 0  Joint Exam 06/07/2024   All documented joints were normal     Investigation: No additional findings.  Imaging: No results found.  Recent Labs: Lab Results  Component Value Date   WBC 5.4 04/17/2024   HGB 13.3 04/17/2024   PLT 225 04/17/2024   NA 140 04/17/2024   K 5.5 (H) 04/17/2024   CL 103 04/17/2024   CO2 22 04/17/2024   GLUCOSE 86 04/17/2024   BUN 22 04/17/2024   CREATININE 0.94 04/17/2024   BILITOT 0.5 04/17/2024   ALKPHOS 61 04/17/2024   AST 20 04/17/2024   ALT 18 04/17/2024   PROT 6.9 04/17/2024   ALBUMIN 4.5 04/17/2024   CALCIUM  9.5 04/17/2024   GFRAA >90 06/05/2014   No results found for: ANA, RF  Speciality Comments: No specialty comments available.  Procedures:  No procedures performed Allergies: Patient has no known allergies.   Assessment / Plan:     Visit Diagnoses:  Positive ANA Patient with mildly positive ANA (1:160) and a positive dsDNA. At this current time, although he has a positive dsDNA abs test, he does not have any features of SLE (no objective malar rash,  inflammatory arthritis, Raynaud's, alopecia, recurrent cytopenias), Sjogren's disease (no sicca symptoms), or scleroderma (no sclerodactyly, no Raynaud's).  Positive ANA need not represent presence of clinically active systemic autoimmune disease.  Can be positive in  the normal population, autoimmune thyroid  disease (Graves' disease, Hashimoto's thyroiditis etc.), or infection. ANA can be positive in the healthy population at the following rates: ANA 1:40: 20%-30%, ANA 1:80: 10%-15%, ANA 1:160: 5%, ANA 1:320: 3% positive, healthy relative of an SLE patient: 5%-25% positive (usually low titers), and elderly (age >70 years): up to 70% positive at ANA titer 1:40.   At this time, will repeat dsDNA to see if abnormal result is still present, will also obtain C3 and C4, Protein / creatinine ratio, urine, Urinalysis.  Right shoulder pain Patient with main complaint of right shoulder pain. Will place ambulatory referral to Orthopedic Surgery for further evaluation.  Orders: Orders Placed This Encounter  Procedures   C3 and C4   Protein / creatinine ratio, urine   Urinalysis   Anti-DNA antibody, double-stranded   Ambulatory referral to Orthopedic Surgery   No orders of the defined types were placed in this encounter.   I personally spent a total of 60 minutes in the care of the patient today including preparing to see the patient, getting/reviewing separately obtained history, performing a medically appropriate exam/evaluation, counseling and educating, placing orders, and documenting clinical information in the EHR.  Follow-Up Instructions: No follow-ups on file.   Asberry Claw, DO

## 2024-06-05 ENCOUNTER — Telehealth: Payer: Self-pay | Admitting: Diagnostic Neuroimaging

## 2024-06-05 NOTE — Telephone Encounter (Signed)
 Patient called to discuss about prescribing the medications discussed at office visit. Would like a call back.

## 2024-06-05 NOTE — Telephone Encounter (Signed)
 Spoke w/Pt regarding meds mentioned in last visit for neuropathy. Pt stated he has an appt with rheumatology on Thurs 9/18. Pt stated he will discuss these meds at that appt and also with his PCP for recommendations on which ones to take. Agreed w/plan based on provider notes from visit. Pt voiced thanks for the call back.

## 2024-06-06 ENCOUNTER — Telehealth: Payer: Self-pay

## 2024-06-06 DIAGNOSIS — R972 Elevated prostate specific antigen [PSA]: Secondary | ICD-10-CM

## 2024-06-06 MED ORDER — LEVOFLOXACIN 750 MG PO TABS: 750.0000 mg | ORAL_TABLET | Freq: Once | ORAL | 0 refills | Status: AC

## 2024-06-06 NOTE — Telephone Encounter (Signed)
-----   Message from Garnette HERO Dahlstedt sent at 06/06/2024 12:44 PM EDT ----- Please call patient-his Exter PSA test was abnormally high.  I would suggest we move ahead with ultrasound and biopsy of his prostate.  Can you put those orders in for me?  Thank you ----- Message ----- From: Sammie Exie HERO, CMA Sent: 06/05/2024  11:33 AM EDT To: Garnette Shack, MD  FYI and advise please

## 2024-06-06 NOTE — Telephone Encounter (Signed)
 Spoke w/ pt wife regarding MD Dahlstedt recommendation pt wife stated that was fine and confirmed appt time and address for instructions

## 2024-06-07 ENCOUNTER — Ambulatory Visit

## 2024-06-07 VITALS — BP 137/71 | HR 60 | Temp 97.3°F | Resp 14 | Ht 68.0 in | Wt 171.6 lb

## 2024-06-07 DIAGNOSIS — M25511 Pain in right shoulder: Secondary | ICD-10-CM | POA: Diagnosis not present

## 2024-06-07 DIAGNOSIS — R768 Other specified abnormal immunological findings in serum: Secondary | ICD-10-CM

## 2024-06-08 LAB — URINALYSIS
Bilirubin Urine: NEGATIVE
Glucose, UA: NEGATIVE
Hgb urine dipstick: NEGATIVE
Leukocytes,Ua: NEGATIVE
Nitrite: NEGATIVE
Specific Gravity, Urine: 1.027 (ref 1.001–1.035)
pH: 5.5 (ref 5.0–8.0)

## 2024-06-08 LAB — PROTEIN / CREATININE RATIO, URINE
Creatinine, Urine: 178 mg/dL (ref 20–320)
Protein/Creat Ratio: 129 mg/g{creat} (ref 25–148)
Protein/Creatinine Ratio: 0.129 mg/mg{creat} (ref 0.025–0.148)
Total Protein, Urine: 23 mg/dL (ref 5–25)

## 2024-06-08 LAB — ANTI-DNA ANTIBODY, DOUBLE-STRANDED: ds DNA Ab: 21 [IU]/mL — ABNORMAL HIGH

## 2024-06-08 LAB — C3 AND C4
C3 Complement: 122 mg/dL
C4 Complement: 18 mg/dL

## 2024-06-18 ENCOUNTER — Other Ambulatory Visit: Payer: Self-pay

## 2024-06-18 DIAGNOSIS — M25511 Pain in right shoulder: Secondary | ICD-10-CM

## 2024-06-25 ENCOUNTER — Ambulatory Visit: Payer: Self-pay

## 2024-06-26 ENCOUNTER — Telehealth: Payer: Self-pay | Admitting: *Deleted

## 2024-06-26 NOTE — Telephone Encounter (Signed)
 Patient contacted the office and request a call back to go over his labs again.  Returned call to patient and advised patient that the repeat test for lupus that was performed prior to seeing me was once again mildly positive. However, the other labs for lupus where negative, which is a good result and suggests that the mildly positive test is not clinically significant to explain his nerve symptoms. Please advise patient to follow-up with neurology like we had discussed at his appointment and to make an appointment with us  if any new symptoms arise that we discussed at his appointment or if he wants to discuss anything further.Patient expressed understanding.

## 2024-07-01 NOTE — Procedures (Signed)
 TRANSRECTAL ULTRASOUND AND PROSTATE BIOPSY  Indication:  {TRUSBX indications:26398}  Prophylactic antibiotic administration: {TRUS antibiotics:26399}  All medications that could result in increased bleeding were discontinued within an appropriate period of the time of biopsy.  Risk including bleeding and infection were discussed.  Informed consent was obtained.  The patient was placed in the left lateral decubitus position.  PROCEDURE 1.  TRANSRECTAL ULTRASOUND OF THE PROSTATE  The 7 MHz transrectal probe was used to image the prostate.  Anal stenosis {WAS/WAS NOT:[phone]::was not} noted.  TRUS volume: *** ml  Hypoechoic areas: {prostate anatomy:26400}  Hyperechoic areas: {prostate anatomy:26400}  Central calcifications: {DESC; PRESENT/NOT PRESENT:21021351}  Margins:  {Normal/Abnormal Appearance:21344::normal}  Seminal Vesicles: {Normal/Abnormal Appearance:21344::normal}   PROCEDURE 2:  PROSTATE BIOPSY  A periprostatic block was performed using 1% lidocaine  and transrectal ultrasound guidance. Under transrectal ultrasound guidance, and using the Biopty gun, prostate biopsies were obtained systematically from the apex, mid gland, and base bilaterally.  A total of *** cores were obtained.  Hemostasis was obtained with gentle pressure on the prostate.  The procedures were well-tolerated.  No significant bleeding was noted at the end of the procedure.  The patient was stable for discharge from the office.

## 2024-07-02 ENCOUNTER — Telehealth: Payer: Self-pay

## 2024-07-02 DIAGNOSIS — R972 Elevated prostate specific antigen [PSA]: Secondary | ICD-10-CM

## 2024-07-02 MED ORDER — LEVOFLOXACIN 750 MG PO TABS: 750.0000 mg | ORAL_TABLET | Freq: Once | ORAL | 0 refills | Status: AC

## 2024-07-02 NOTE — Telephone Encounter (Signed)
 Return call to pt regarding Abt for prostate biopsy. Pt is made aware Levaquin  sent in to pharmacy for biopsy. Verbalized undrstanding.

## 2024-07-03 ENCOUNTER — Ambulatory Visit: Admitting: Urology

## 2024-07-03 ENCOUNTER — Ambulatory Visit (HOSPITAL_COMMUNITY)
Admission: RE | Admit: 2024-07-03 | Discharge: 2024-07-03 | Disposition: A | Discharge status: Home / Self Care | Source: Ambulatory Visit | Attending: Urology | Admitting: Urology

## 2024-07-03 ENCOUNTER — Encounter (HOSPITAL_COMMUNITY): Payer: Self-pay

## 2024-07-03 ENCOUNTER — Other Ambulatory Visit: Payer: Self-pay | Admitting: Urology

## 2024-07-03 DIAGNOSIS — C61 Malignant neoplasm of prostate: Secondary | ICD-10-CM

## 2024-07-03 DIAGNOSIS — R972 Elevated prostate specific antigen [PSA]: Secondary | ICD-10-CM | POA: Diagnosis not present

## 2024-07-03 DIAGNOSIS — N4239 Other dysplasia of prostate: Secondary | ICD-10-CM

## 2024-07-03 MED ORDER — LIDOCAINE HCL (PF) 2 % IJ SOLN
10.0000 mL | Freq: Once | INTRAMUSCULAR | Status: AC
Start: 2024-07-03 — End: 2024-07-03
  Administered 2024-07-03: 10 mL

## 2024-07-03 MED ORDER — LIDOCAINE HCL (PF) 2 % IJ SOLN
INTRAMUSCULAR | Status: AC
  Filled 2024-07-03: qty 10

## 2024-07-03 MED ORDER — GENTAMICIN SULFATE 40 MG/ML IJ SOLN
160.0000 mg | Freq: Once | INTRAMUSCULAR | Status: AC
Start: 2024-07-03 — End: 2024-07-03
  Administered 2024-07-03: 160 mg via INTRAMUSCULAR

## 2024-07-03 MED ORDER — GENTAMICIN SULFATE 40 MG/ML IJ SOLN
INTRAMUSCULAR | Status: AC
  Filled 2024-07-03: qty 4

## 2024-07-03 NOTE — Progress Notes (Signed)
 PT tolerated prostate biopsy procedure and antibiotic injection well today. Labs obtained and sent for pathology by Lexi from ultrasound. PT ambulatory with walker at discharge with no acute distress noted and verbalized understanding of discharge instructions.

## 2024-07-04 LAB — SURGICAL PATHOLOGY

## 2024-07-11 ENCOUNTER — Telehealth: Payer: Self-pay | Admitting: Urology

## 2024-07-11 NOTE — Telephone Encounter (Signed)
 Pt was made aware Biopsy was routed to Md, Dahlstedt for review and someone will reach out with MD recommendations. Pt voiced understanding

## 2024-07-11 NOTE — Telephone Encounter (Signed)
 Patient returned call requesting Biopsy results.   Test was completed on 07/03/2024   Best number to contact patient [phone] Call transferred to clinical basket.

## 2024-07-16 ENCOUNTER — Telehealth: Payer: Self-pay

## 2024-07-16 NOTE — Telephone Encounter (Signed)
 Decipher Testing Order: [id number]  Test requisition scanned into pt chart

## 2024-08-21 ENCOUNTER — Telehealth: Payer: Self-pay

## 2024-08-21 NOTE — Telephone Encounter (Signed)
 Called and lvm for pt to let him know decipher results were sent to MD Dahlstedt for review

## 2024-08-21 NOTE — Telephone Encounter (Signed)
 Patient returned call requesting biopsy results.   Biopsy was completed on 07-03-2024  Best number to contact patient - [phone]

## 2024-08-28 NOTE — Telephone Encounter (Signed)
 Pt was made aware Decipher was routed to MD and once review someone will give him a call back with results. Pt voiced understanding

## 2024-08-28 NOTE — Telephone Encounter (Signed)
 Patient called back wanting to get Decipher results, he said he has been waiting since October

## 2024-09-24 ENCOUNTER — Other Ambulatory Visit: Payer: Self-pay | Admitting: Urology

## 2024-09-24 DIAGNOSIS — C61 Malignant neoplasm of prostate: Secondary | ICD-10-CM

## 2024-10-11 ENCOUNTER — Telehealth: Payer: Self-pay | Admitting: Urology

## 2024-10-11 NOTE — Telephone Encounter (Signed)
 William [name] at [facility] needs Pet[employer] order put in. If you need her call [phone]

## 2024-10-12 NOTE — Telephone Encounter (Signed)
 Message sent through referral to MD

## 2024-10-15 ENCOUNTER — Other Ambulatory Visit: Payer: Self-pay | Admitting: Urology

## 2024-10-15 DIAGNOSIS — C61 Malignant neoplasm of prostate: Secondary | ICD-10-CM

## 2024-11-01 ENCOUNTER — Encounter (HOSPITAL_COMMUNITY)
